# Patient Record
Sex: Female | Born: 1960 | Hispanic: No | Marital: Married | Smoking: Never smoker
Health system: Southern US, Community
[De-identification: ages and names within clinical notes are randomized; demographics above are authoritative.]

## PROBLEM LIST (undated history)

## (undated) DIAGNOSIS — G4730 Sleep apnea, unspecified: Secondary | ICD-10-CM

## (undated) DIAGNOSIS — G43009 Migraine without aura, not intractable, without status migrainosus: Secondary | ICD-10-CM

## (undated) DIAGNOSIS — E781 Pure hyperglyceridemia: Secondary | ICD-10-CM

## (undated) DIAGNOSIS — E669 Obesity, unspecified: Secondary | ICD-10-CM

## (undated) DIAGNOSIS — G4733 Obstructive sleep apnea (adult) (pediatric): Secondary | ICD-10-CM

## (undated) HISTORY — DX: Sleep apnea, unspecified: G47.30

## (undated) HISTORY — DX: Pure hyperglyceridemia: E78.1

## (undated) HISTORY — DX: Migraine without aura, not intractable, without status migrainosus: G43.009

## (undated) HISTORY — PX: COLON SURGERY: SHX602

## (undated) HISTORY — DX: Obesity, unspecified: E66.9

## (undated) HISTORY — PX: CHOLECYSTECTOMY: SHX55

## (undated) HISTORY — DX: Obstructive sleep apnea (adult) (pediatric): G47.33

---

## 1998-11-25 ENCOUNTER — Other Ambulatory Visit: Admission: RE | Admit: 1998-11-25 | Discharge: 1998-11-25 | Payer: Self-pay | Admitting: Obstetrics and Gynecology

## 1999-11-04 ENCOUNTER — Other Ambulatory Visit: Admission: RE | Admit: 1999-11-04 | Discharge: 1999-11-04 | Payer: Self-pay | Admitting: Obstetrics and Gynecology

## 2000-11-07 ENCOUNTER — Other Ambulatory Visit: Admission: RE | Admit: 2000-11-07 | Discharge: 2000-11-07 | Payer: Self-pay | Admitting: Obstetrics and Gynecology

## 2001-11-14 ENCOUNTER — Other Ambulatory Visit: Admission: RE | Admit: 2001-11-14 | Discharge: 2001-11-14 | Payer: Self-pay | Admitting: Obstetrics and Gynecology

## 2002-11-19 ENCOUNTER — Other Ambulatory Visit: Admission: RE | Admit: 2002-11-19 | Discharge: 2002-11-19 | Payer: Self-pay | Admitting: Obstetrics and Gynecology

## 2003-11-24 ENCOUNTER — Other Ambulatory Visit: Admission: RE | Admit: 2003-11-24 | Discharge: 2003-11-24 | Payer: Self-pay | Admitting: Obstetrics and Gynecology

## 2004-12-08 ENCOUNTER — Other Ambulatory Visit: Admission: RE | Admit: 2004-12-08 | Discharge: 2004-12-08 | Payer: Self-pay | Admitting: Obstetrics and Gynecology

## 2005-02-14 HISTORY — PX: ENDOMETRIAL ABLATION: SHX621

## 2005-12-28 ENCOUNTER — Other Ambulatory Visit: Admission: RE | Admit: 2005-12-28 | Discharge: 2005-12-28 | Payer: Self-pay | Admitting: Obstetrics and Gynecology

## 2006-03-21 ENCOUNTER — Ambulatory Visit (HOSPITAL_BASED_OUTPATIENT_CLINIC_OR_DEPARTMENT_OTHER): Admission: RE | Admit: 2006-03-21 | Discharge: 2006-03-21 | Payer: Self-pay | Admitting: Obstetrics and Gynecology

## 2006-03-21 ENCOUNTER — Encounter (INDEPENDENT_AMBULATORY_CARE_PROVIDER_SITE_OTHER): Payer: Self-pay | Admitting: *Deleted

## 2007-02-19 ENCOUNTER — Encounter: Admission: RE | Admit: 2007-02-19 | Discharge: 2007-02-19 | Payer: Self-pay | Admitting: Family Medicine

## 2007-05-07 ENCOUNTER — Other Ambulatory Visit: Admission: RE | Admit: 2007-05-07 | Discharge: 2007-05-07 | Payer: Self-pay | Admitting: Obstetrics and Gynecology

## 2007-05-27 DIAGNOSIS — G4733 Obstructive sleep apnea (adult) (pediatric): Secondary | ICD-10-CM

## 2007-05-27 HISTORY — DX: Obstructive sleep apnea (adult) (pediatric): G47.33

## 2008-01-31 IMAGING — US US SOFT TISSUE HEAD/NECK
1 series · 14 of 15 positions shown · non-contrast
Comparison: Ultrasound of the thyroid from [HOSPITAL]
dated 09/18/2006

CLINICAL DATA: Enlarged thyroid, follow-up

THYROID ULTRASOUND
TECHNIQUE: Ultrasound examination of the thyroid gland and
adjacent soft tissues was performed.

[Series 1: (person_name) · 0.05mm/px · 14 of 15 slices shown]
[im 1/15]
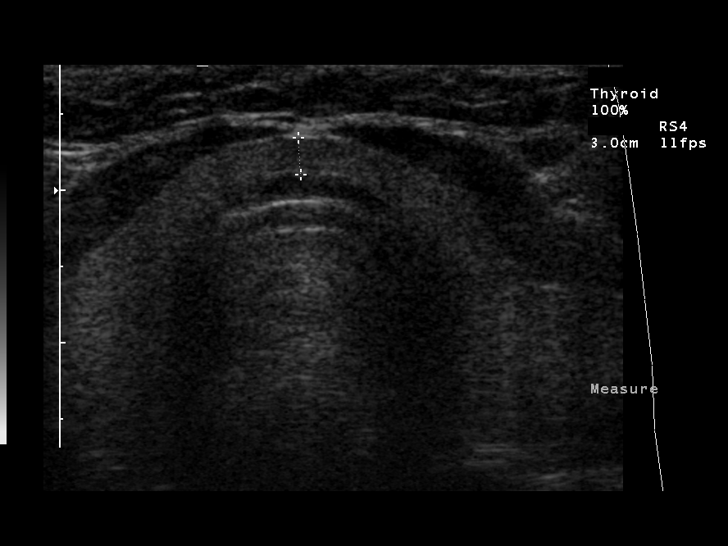
[im 2/15]
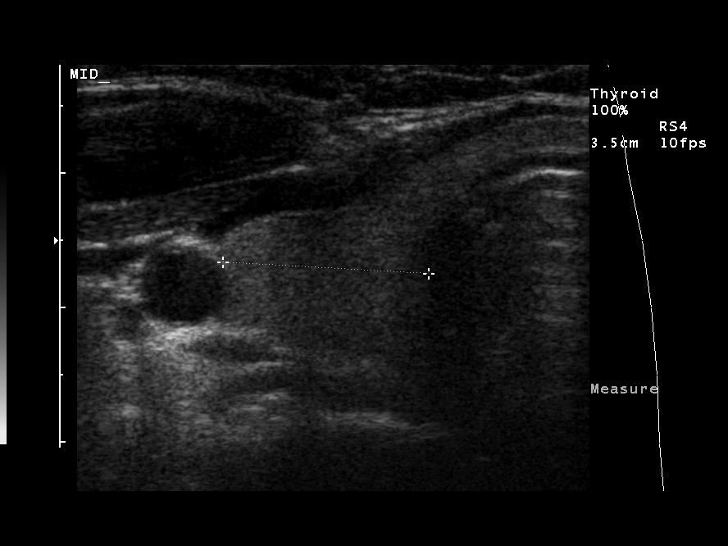
[im 3/15]
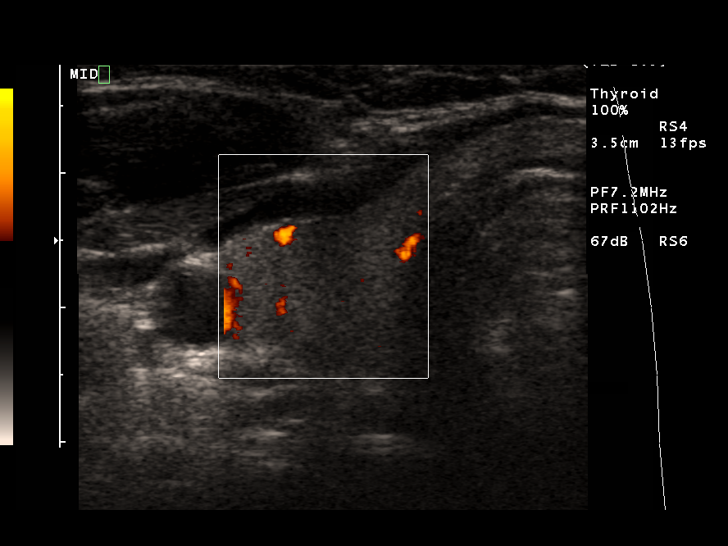
[im 4/15]
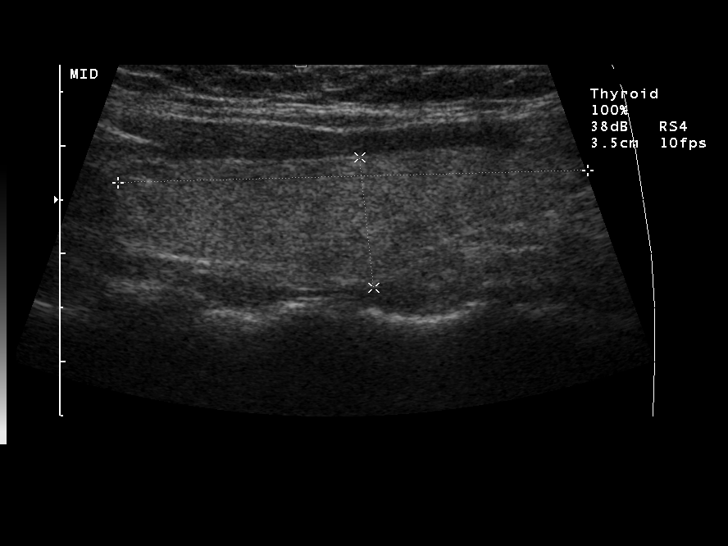
[im 5/15]
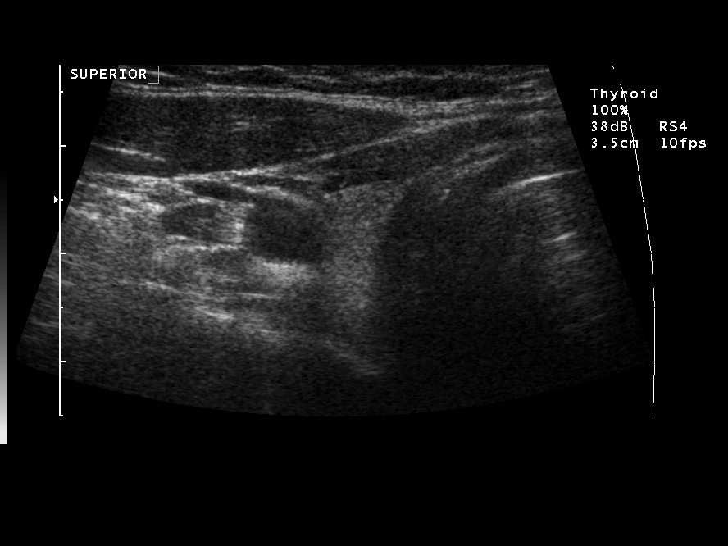
[im 6/15]
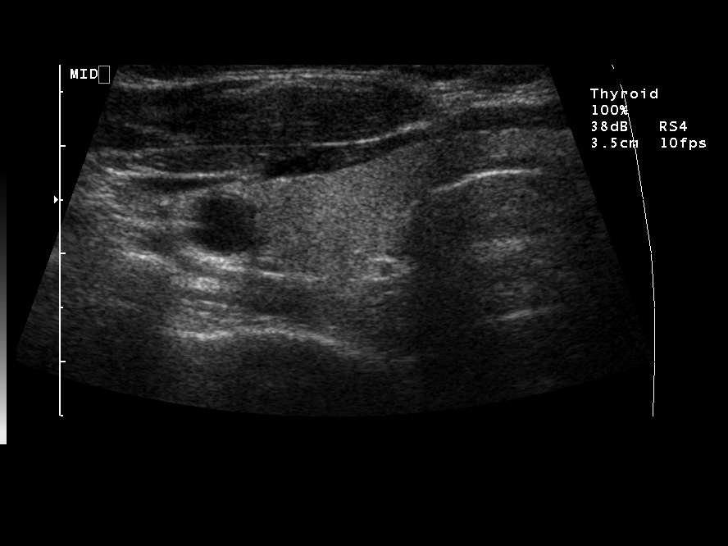
[im 7/15]
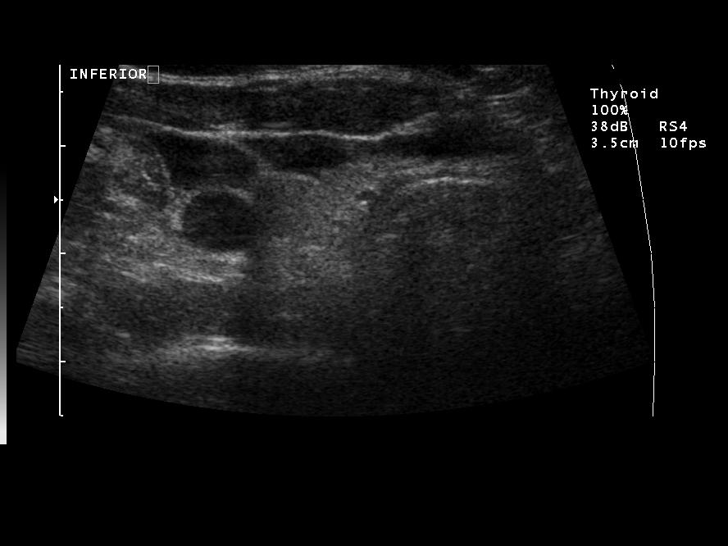
[im 9/15]
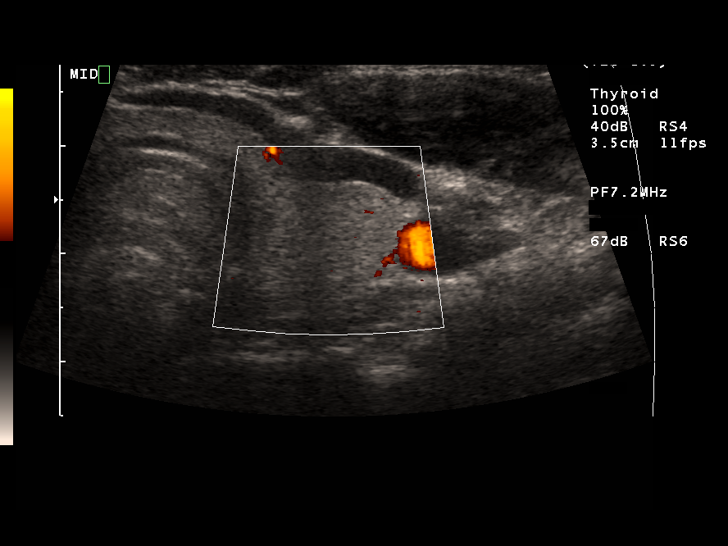
[im 10/15]
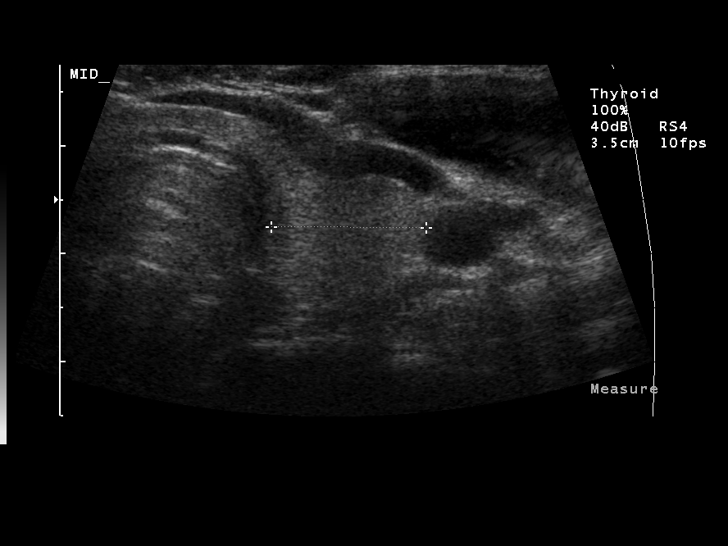
[im 11/15]
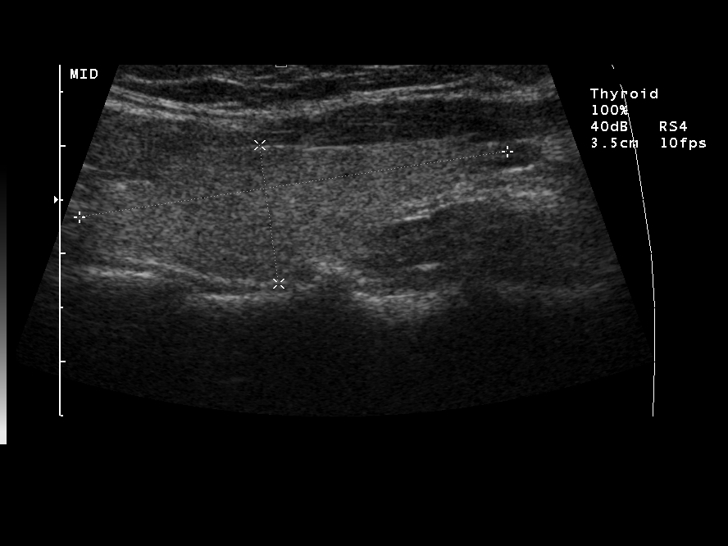
[im 12/15]
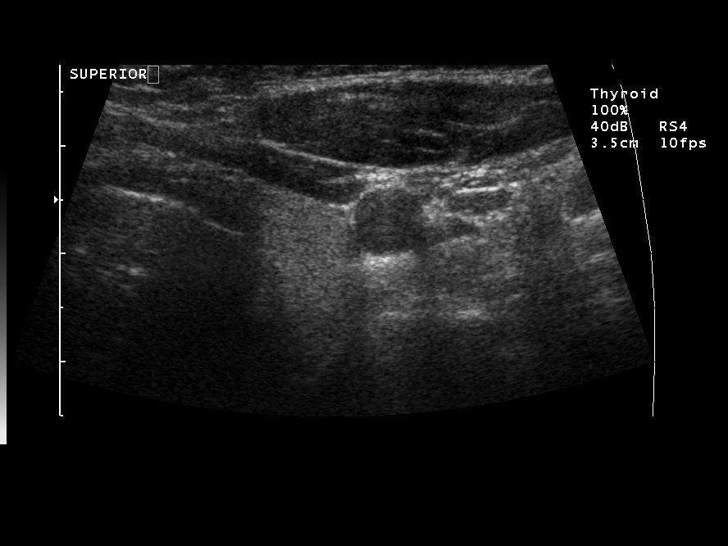
[im 13/15]
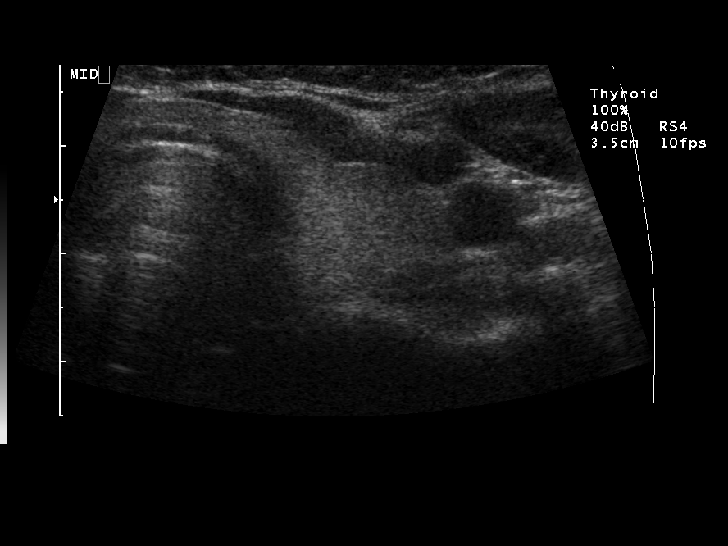
[im 14/15]
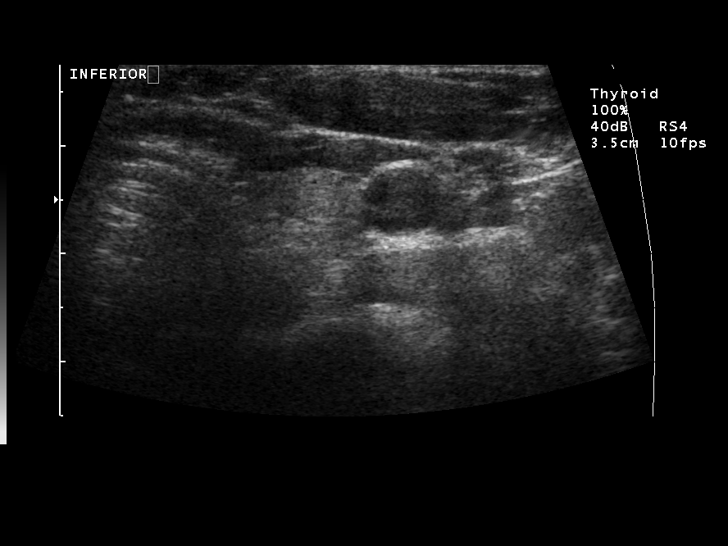
[im 15/15]
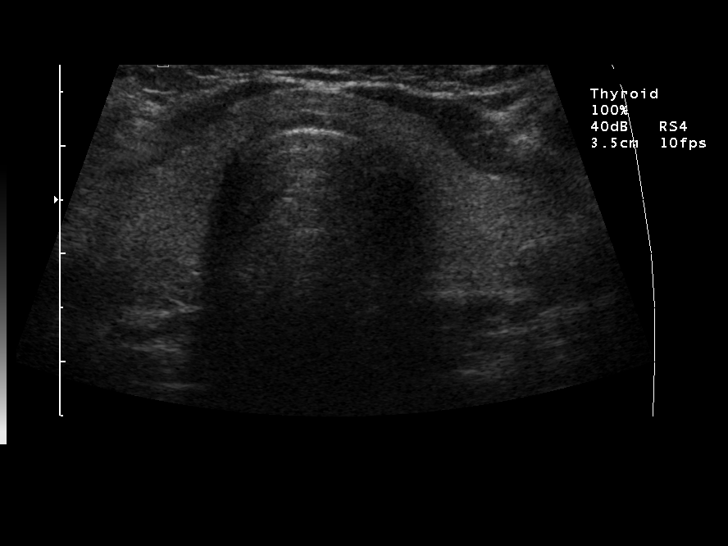

[14 of 15 positions shown; findings below may reference images not displayed]

FINDINGS: The thyroid gland is stable in size.  The right lobe
measures 4.9 cm sagittally, with a depth of 1.6 cm and width of
cm.  (Prior measurements were 4.4 x 1.2 x 1.5 cm).  The left lobe
currently measures 4.3 x 1.5 x 1.6 cm, with the isthmus measuring 3
mm.  (Prior measurements were 4.0 x 1.3 x 1.4 cm with the isthmus
measuring 2.4 mm).  No solid or cystic nodule is seen.
IMPRESSION: The thyroid gland is normal in size with no solid or cystic nodule
noted.

## 2008-05-07 ENCOUNTER — Other Ambulatory Visit: Admission: RE | Admit: 2008-05-07 | Discharge: 2008-05-07 | Payer: Self-pay | Admitting: Obstetrics and Gynecology

## 2008-07-03 IMAGING — US US ABDOMEN COMPLETE
1 series · 14 of 25 positions shown · non-contrast
Comparison: None.

CLINICAL DATA: Palpable mass right flank. 
 COMPLETE ABDOMINAL ULTRASOUND:
TECHNIQUE: Complete abdominal ultrasound examination was performed including evaluation of the liver, gallbladder, bile ducts, pancreas, kidneys, spleen, IVC, and abdominal aorta.

[Series 1: us abdomen complete · 0.24mm/px · 14 of 70 slices shown]
[im 1/70]
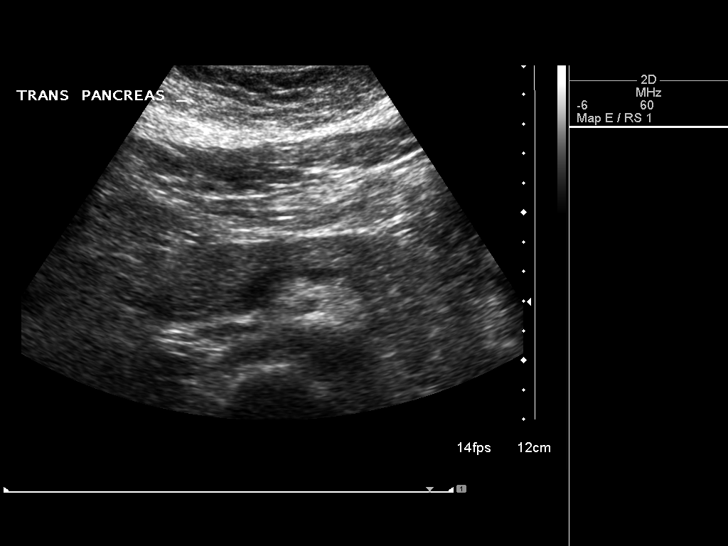
[im 6/70]
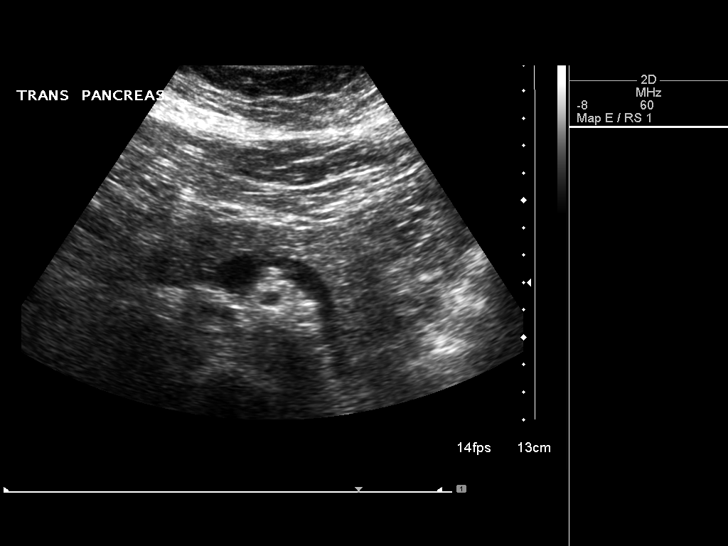
[im 12/70]
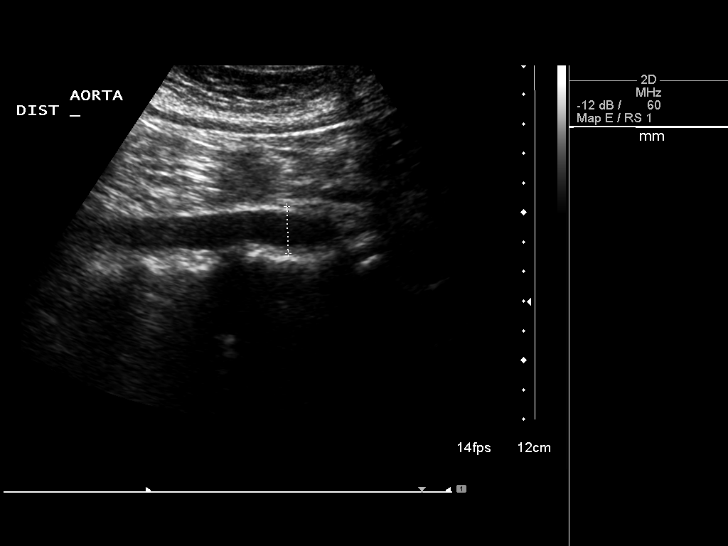
[im 18/70]
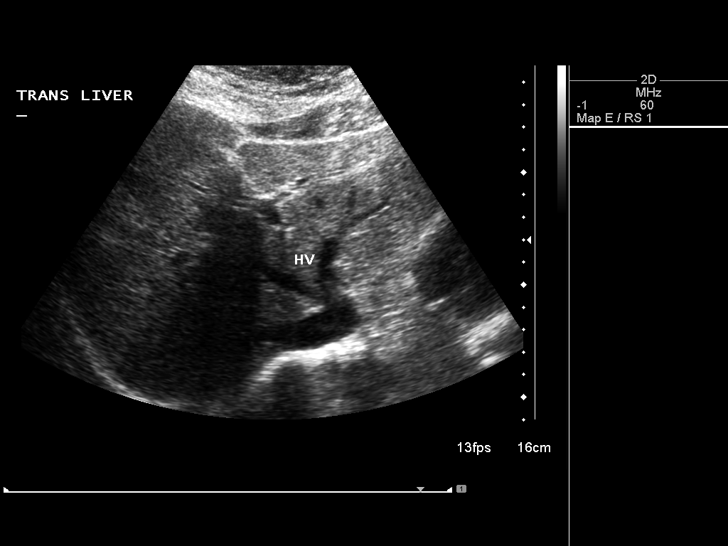
[im 24/70]
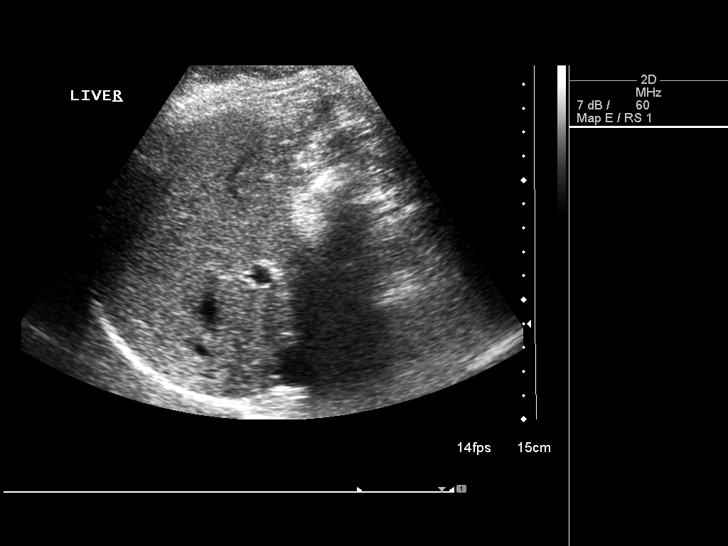
[im 26/70]
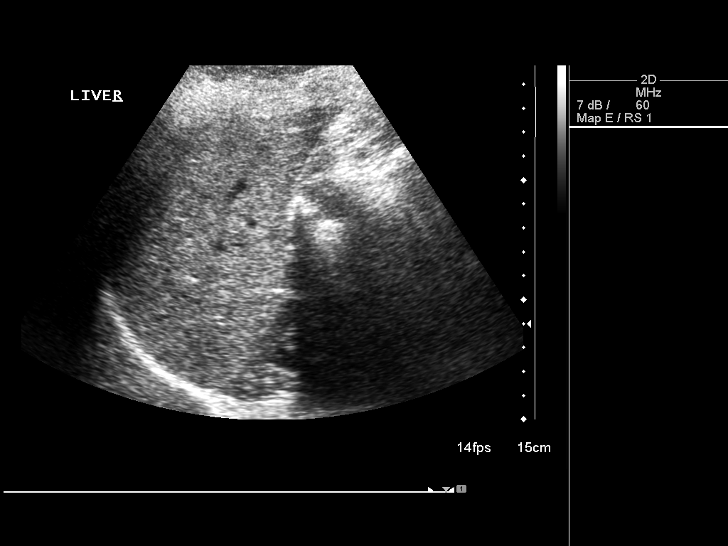
[im 32/70]
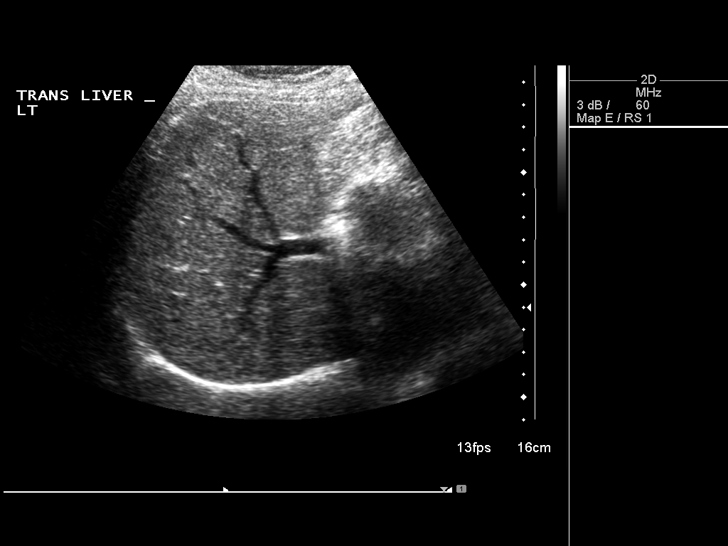
[im 38/70]
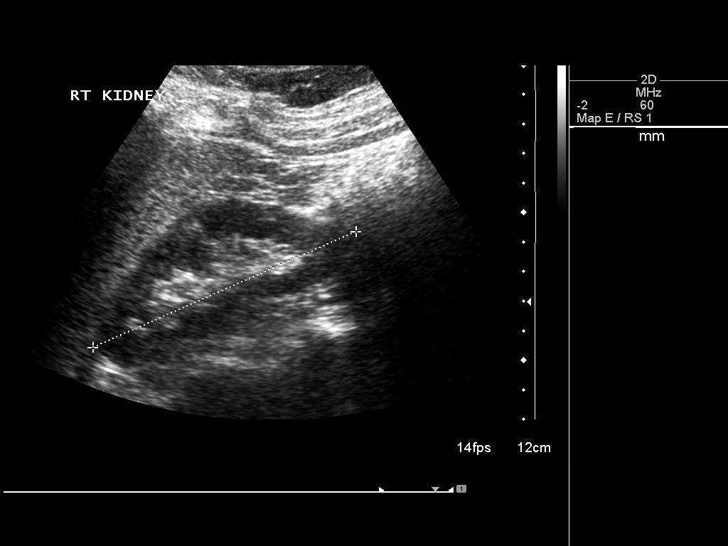
[im 44/70]
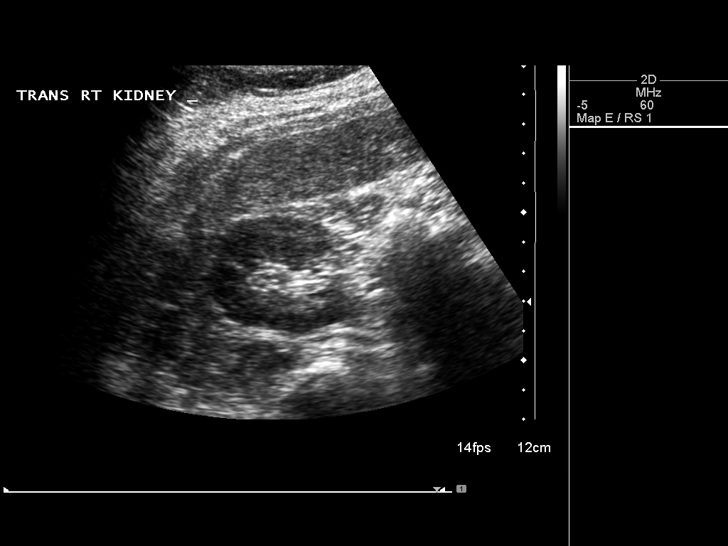
[im 47/70]
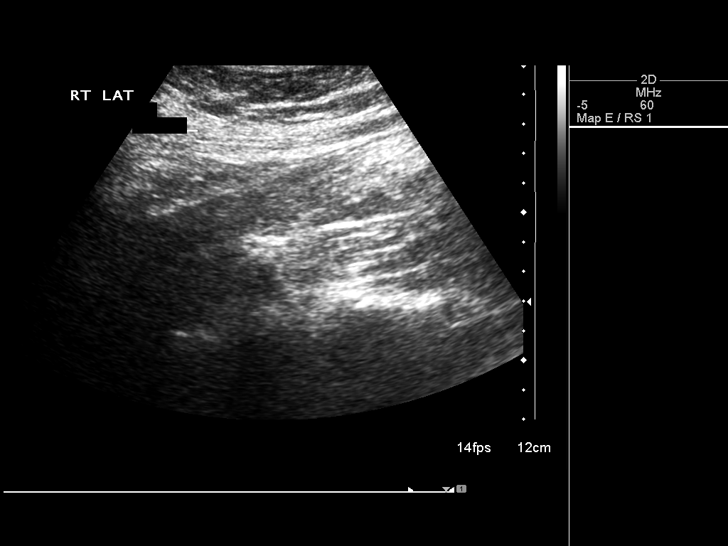
[im 52/70]
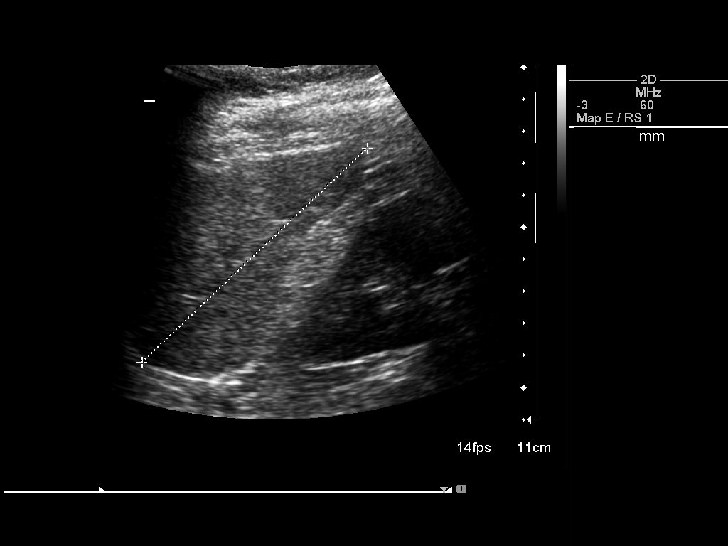
[im 58/70]
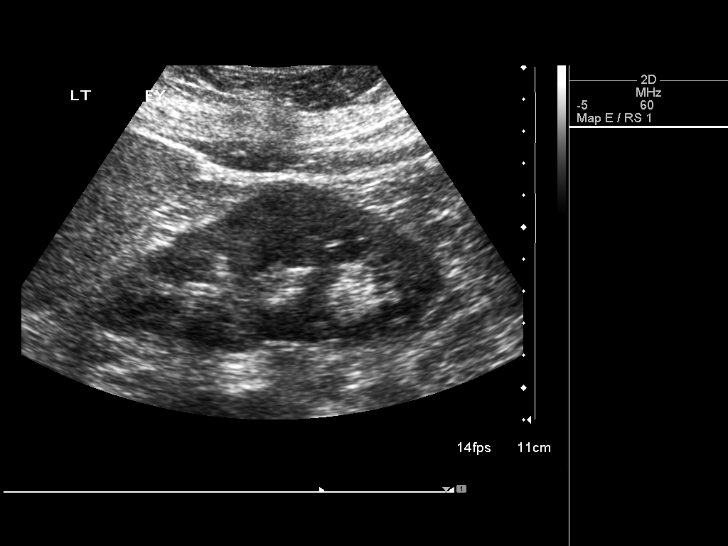
[im 64/70]
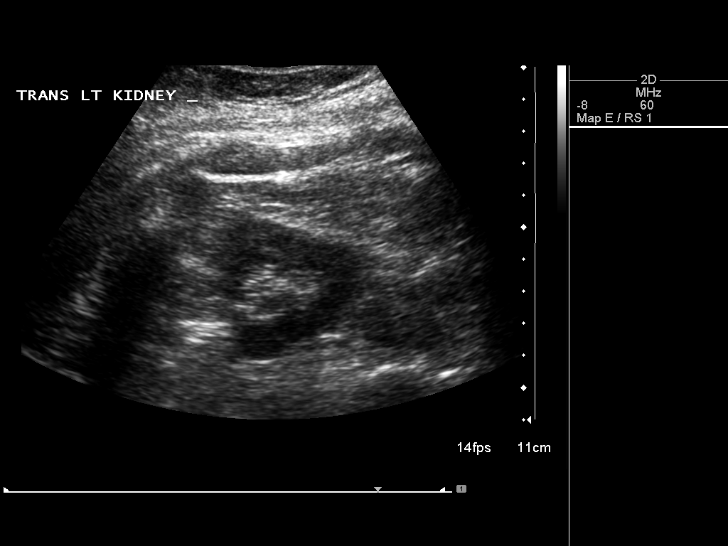
[im 70/70]
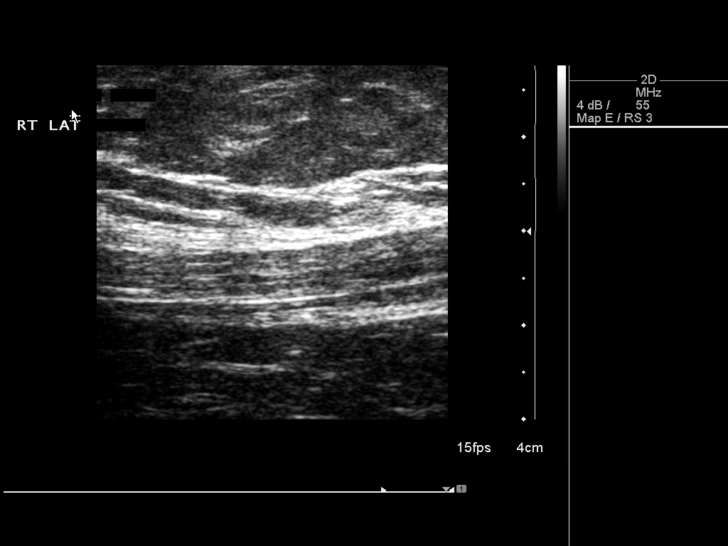

[14 of 25 positions shown; findings below may reference images not displayed]

FINDINGS: Liver is unremarkable.  Gallbladder is absent.  Extrahepatic bile duct, IVC, pancreas, spleen, kidneys, and aorta unremarkable.  No solid mass is seen at the site of palpable abnormality in the right lower quadrant.
IMPRESSION: No acute findings.  No mass.

## 2009-05-25 ENCOUNTER — Encounter: Admission: RE | Admit: 2009-05-25 | Discharge: 2009-05-25 | Payer: Self-pay | Admitting: Obstetrics and Gynecology

## 2010-07-02 NOTE — Op Note (Signed)
[name], Suzanne Schmidt                ACCOUNT NO.:  [id number]   MEDICAL RECORD NO.:  [id number]          PATIENT TYPE:  AMB   LOCATION:  [facility]                         FACILITY:  [facility]   PHYSICIAN:  Cynthia P. Romine, M.D.DATE OF BIRTH:  [birth year]   DATE OF PROCEDURE:  03/21/2006  DATE OF DISCHARGE:                               OPERATIVE REPORT   PREOPERATIVE DIAGNOSIS:  Menorrhagia with known submucous fibroids.   POSTOPERATIVE DIAGNOSIS:  Menorrhagia with known submucous fibroids,  path pending.   PROCEDURE:  Diagnostic laparoscopy, endometrial ablation using  Hydrotherm ablation and hysteroscopic resection of submucous fibroid.   SURGEON:  Dr. Arline Asp Romine.   ANESTHESIA:  General by LMA.   ESTIMATED BLOOD LOSS:  25 mL.   SORBITOL DEFICIT:  200 mL.   COMPLICATIONS:  None.   PROCEDURE:  The patient was taken to the operating room and after  induction of adequate general anesthesia was placed in dorsal lithotomy  position and prepped and draped in usual fashion.  A posterior weighted  and anterior Sims retractor placed.  The cervix was grasped on its  anterior lip with a single-tooth tenaculum.  The cervix was dilated to  #23 Shawnie Pons.  The HTA hysteroscope was introduced.  The three fibroids in  the endometrial cavity were seen as well as the tubal ostia.  Photographic documentation was taken.  As the procedure was begun and  the fluid was warming, there was noted to be a 10 mL loss at 6 mL per  minute.  It was felt that this was because the way to the hysteroscope  was then set up, the operator was unable to use the tenaculum stabilizer  on the hysteroscope.  Therefore the fluid was cooled, the sheath was  then removed from the uterus.  The hysteroscope was arranged so that the  tenaculum stabilizer could be used.  The hysteroscope was then  reinserted.  The procedure was started all over again.  The scope was  withdrawn to just inside the internal os.  A tenaculum stabilizer  was  used and the procedure was restarted there was no loss of fluid at all  and the HTA proceeded without incident throughout the 10 minute ablation  time.  Following the completion of the HTA, photographic documentation  was again taken.  After complete cooling of fluid, the scope was  withdrawn.  The cervix was dilated to #31 Shawnie Pons and the operative  hysteroscope was introduced.  Sorbitol was used as a distension medium  at 70 mmHg.  The noted fibroids were resected with a single loop.  They  were quite large and were taken out in multiple passes. The  hysteroscopic resection was very successful, the cavity was cleared  fibroids following completion of resection.  Photographic  documentation was taken.  The hysteroscope was removed from the uterus.  The instruments removed from the vagina and the procedure was  terminated.  The patient was taken in satisfactory condition to post  anesthesia recovery.      Cynthia P. Romine, M.D.  Electronically Signed     CPR/MEDQ  D:  03/21/2006  T:  03/21/2006  Job:  [id number]

## 2012-07-11 ENCOUNTER — Encounter: Payer: Self-pay | Admitting: *Deleted

## 2012-07-13 ENCOUNTER — Ambulatory Visit (INDEPENDENT_AMBULATORY_CARE_PROVIDER_SITE_OTHER): Payer: BC Managed Care – PPO | Admitting: Obstetrics and Gynecology

## 2012-07-13 ENCOUNTER — Encounter: Payer: Self-pay | Admitting: Obstetrics and Gynecology

## 2012-07-13 VITALS — BP 110/70 | HR 72 | Resp 16 | Ht 59.0 in | Wt 140.2 lb

## 2012-07-13 DIAGNOSIS — Z0000 Encounter for general adult medical examination without abnormal findings: Secondary | ICD-10-CM

## 2012-07-13 DIAGNOSIS — Z01419 Encounter for gynecological examination (general) (routine) without abnormal findings: Secondary | ICD-10-CM

## 2012-07-13 LAB — POCT URINALYSIS DIPSTICK
Glucose, UA: NEGATIVE
Leukocytes, UA: NEGATIVE
Nitrite, UA: NEGATIVE
pH, UA: 5

## 2012-07-13 MED ORDER — NORETHINDRONE 0.35 MG PO TABS: 1.0000 | ORAL_TABLET | Freq: Every day | ORAL | Status: DC

## 2012-07-13 NOTE — Patient Instructions (Addendum)

## 2012-07-13 NOTE — Progress Notes (Signed)
52 y.o.   Married    Caucasian   female   G2P2   here for annual exam.  Amenorrheic on [place]; s/p HTA in 2007 as well.  No v-m sx.  Steroid injection into right shoulder for rotator cuff irritation;  Now having same problem with left shoulder. Wears CPAP for sleep apnea.   No LMP recorded. Patient is postmenopausal.          Sexually active: yes  The current method of family planning is OCP (estrogen/progesterone).    Exercising: Not doing any right now intends to start after seeing health coach Last mammogram:  06/27/11 scheduled for next week Last pap smear:05/13/09 History of abnormal pap:no  Smoking:no Alcohol:no Last colonoscopy:6/11 Last Bone Density:  03 oseopenia Last tetanus shot:3/09 Last cholesterol check: 4/14 [facility]  Hgb:                Urine:normal   Family History  Problem Relation Age of Onset  . Osteoporosis Mother   . Hypertension Father   . Macular degeneration Father     There are no active problems to display for this patient.   Past Medical History  Diagnosis Date  . Sleep apnea   . Migraine without aura     Past Surgical History  Procedure Laterality Date  . Cholecystectomy    . Cesarean section      x2  . Endometrial ablation    . Colon surgery      Allergies: Review of patient's allergies indicates no known allergies.  Current Outpatient Prescriptions  Medication Sig Dispense Refill  . Aspirin-Acetaminophen-Caffeine (MIGRAINE FORMULA PO) Take by mouth.      Marland Kitchen atenolol (TENORMIN) 100 MG tablet Take 100 mg by mouth daily.      . Calcium Carbonate (CALCIUM 600 PO) Take by mouth.      . fish oil-omega-3 fatty acids 1000 MG capsule Take 2 g by mouth daily.      . Multiple Vitamin (MULTIVITAMIN) capsule Take 1 capsule by mouth daily.      . norethindrone (MICRONOR,CAMILA,ERRIN) 0.35 MG tablet Take 1 tablet by mouth daily.      . rizatriptan (MAXALT) 10 MG tablet Take 10 mg by mouth as needed for migraine. May repeat in 2  hours if needed      . Vitamin D, Ergocalciferol, (DRISDOL) 50000 UNITS CAPS Take 50,000 Units by mouth.       No current facility-administered medications for this visit.    ROS: Pertinent items are noted in HPI.  Social Hx:  Married, daughter Sharyl Nimrod, married with a 9 yo son Heloise Purpura and son Nicola Girt, a rising Sr. In history at [employer]. Pt works as Psychologist, counselling  Exam:    BP 110/70  Pulse 72  Resp 16  Ht 4\' 11"  (1.499 m)  Wt 140 lb 3.2 oz (63.594 kg)  BMI 28.3 kg/m2  Ht stable, wt up 4 pounds since last  year Wt Readings from Last 3 Encounters:  07/13/12 140 lb 3.2 oz (63.594 kg)     Ht Readings from Last 3 Encounters:  07/13/12 4\' 11"  (1.499 m)    General appearance: alert, cooperative and appears stated age Head: Normocephalic, without obvious abnormality, atraumatic Neck: no adenopathy, supple, symmetrical, trachea midline and thyroid not enlarged, symmetric, no tenderness/mass/nodules Lungs: clear to auscultation bilaterally Breasts: Inspection negative, No nipple retraction or dimpling, No nipple discharge or bleeding, No axillary or supraclavicular adenopathy, Normal to palpation without dominant masses Heart: regular rate  and rhythm Abdomen: soft, non-tender; bowel sounds normal; no masses,  no organomegaly Extremities: extremities normal, atraumatic, no cyanosis or edema Skin: Skin color, texture, turgor normal. No rashes or lesions Lymph nodes: Cervical, supraclavicular, and axillary nodes normal. No abnormal inguinal nodes palpated Neurologic: Grossly normal   Pelvic: External genitalia:  no lesions              Urethra:  normal appearing urethra with no masses, tenderness or lesions              Bartholins and Skenes: normal                 Vagina: normal appearing vagina with normal color and discharge, no lesions              Cervix: normal appearance              Pap taken: yes        Bimanual Exam:  Uterus:  uterus is normal size, shape, consistency  and non tender, mid, mobile                                      Adnexa: normal adnexa in size, nontender and no masses                                      Rectovaginal: Confirms                                      Anus:  normal sphincter tone, no lesions  A: normal peri-menopausal exam     S/p HTA 2007     Osteoporosis risk with mother with severe osteoporosis     Sleep apnea, uses CPAP     Migraine w/o aura     P: mammogram pap smear counseled on breast self exam, mammography screening, adequate intake of calcium and vitamin D, diet and exercise return annually or prn   Continue camila for one more year, and then consider d/c and possibly HRT if desired.   Change to OTC Vit D 1000 U per day.   An After Visit Summary was printed and given to the patient.

## 2012-07-16 LAB — HEMOGLOBIN, FINGERSTICK: Hemoglobin, fingerstick: 15.4 g/dL (ref 12.0–16.0)

## 2012-07-18 LAB — IPS PAP TEST WITH HPV

## 2012-07-25 ENCOUNTER — Telehealth: Payer: Self-pay | Admitting: *Deleted

## 2012-07-25 NOTE — Telephone Encounter (Signed)
Form for [facility] in your office to sign/ Dr. Tresa Res patient.

## 2012-07-27 ENCOUNTER — Telehealth: Payer: Self-pay | Admitting: *Deleted

## 2012-07-27 NOTE — Telephone Encounter (Signed)
[facility] mammogram on hold per Dr. Jonathon Bellows. Romine/sue

## 2012-07-29 ENCOUNTER — Other Ambulatory Visit: Payer: Self-pay | Admitting: Obstetrics and Gynecology

## 2012-07-30 NOTE — Telephone Encounter (Signed)
RX filled on 07/13/12.  RX denied.

## 2012-08-02 ENCOUNTER — Other Ambulatory Visit: Payer: Self-pay | Admitting: Obstetrics and Gynecology

## 2012-08-02 NOTE — Telephone Encounter (Signed)
Patient was seen 07/13/12 no lab done for Vitamin D please advise on refill.

## 2012-08-02 NOTE — Telephone Encounter (Signed)
Need chart please

## 2012-08-03 NOTE — Telephone Encounter (Signed)
Chart on your desk.

## 2012-08-03 NOTE — Telephone Encounter (Signed)
Please let her know we should repeat Vit D.  Last one was 2011.  Please make sure front knows no copay charge for lab visit.  I will put in order if you tell me when she is coming.

## 2012-08-03 NOTE — Telephone Encounter (Signed)
Left message for call back.

## 2012-08-08 NOTE — Telephone Encounter (Signed)
I have attempted to contact this patient by phone with the following results: left message to return my call on answering machine (mobile).  

## 2012-08-09 NOTE — Telephone Encounter (Signed)
I have attempted to contact this patient by phone with the following results: left message to return my call on answering machine (home).  

## 2012-08-10 NOTE — Telephone Encounter (Signed)
Encounter closed

## 2012-08-10 NOTE — Telephone Encounter (Signed)
Stop calling.  Close encounter.

## 2012-12-20 ENCOUNTER — Other Ambulatory Visit: Payer: Self-pay

## 2013-04-10 ENCOUNTER — Encounter: Payer: Self-pay | Admitting: Obstetrics & Gynecology

## 2013-04-25 ENCOUNTER — Encounter: Payer: Self-pay | Admitting: General Surgery

## 2013-04-25 DIAGNOSIS — G4733 Obstructive sleep apnea (adult) (pediatric): Secondary | ICD-10-CM | POA: Insufficient documentation

## 2013-04-25 DIAGNOSIS — E669 Obesity, unspecified: Secondary | ICD-10-CM | POA: Insufficient documentation

## 2013-04-30 ENCOUNTER — Encounter: Payer: Self-pay | Admitting: Cardiology

## 2013-04-30 ENCOUNTER — Ambulatory Visit (INDEPENDENT_AMBULATORY_CARE_PROVIDER_SITE_OTHER): Payer: BC Managed Care – PPO | Admitting: Cardiology

## 2013-04-30 VITALS — BP 135/75 | HR 84 | Ht 59.0 in | Wt 143.0 lb

## 2013-04-30 DIAGNOSIS — E669 Obesity, unspecified: Secondary | ICD-10-CM

## 2013-04-30 DIAGNOSIS — G4733 Obstructive sleep apnea (adult) (pediatric): Secondary | ICD-10-CM

## 2013-04-30 NOTE — Progress Notes (Signed)
[address] [place], [place]  [address] Phone: [phone] Fax:  [phone]  Date:  04/30/2013   ID:  Suzanne Breachaula G Lesure, DOB 01/22/1961, MRN [id number]  PCP:  Gweneth DimitriMCNEILL,WENDY, MD  Sleep Medicine:  Armanda Magictraci Abrham Maslowski, MD   History of Present Illness: Suzanne Schmidt is a 53 y.o. female with a history of OSA and obesity who presents today for followup.  She is doing well.  She tolerates her CPAP device without any problems.  She tolerates the nasal pillow mask and feels the pressure is adequate.  She does not snore and does not thinks she mouth breathes any.  She feels rested in the am and has no daytime sleepiness.   Wt Readings from Last 3 Encounters:  04/30/13 143 lb (64.864 kg)  07/13/12 140 lb 3.2 oz (63.594 kg)     Past Medical History  Diagnosis Date  . Sleep apnea   . Migraine without aura   . OSA (obstructive sleep apnea) 05/27/2007    PSG w RDI 16.3 and AHI 4.52   . Hypertriglyceridemia   . Obesity     Current Outpatient Prescriptions  Medication Sig Dispense Refill  . Aspirin-Acetaminophen-Caffeine (MIGRAINE FORMULA PO) Take by mouth.      Marland Kitchen. atenolol (TENORMIN) 100 MG tablet Take 100 mg by mouth daily.      . Calcium Carbonate (CALCIUM 600 PO) Take by mouth.      . fish oil-omega-3 fatty acids 1000 MG capsule Take 2 g by mouth daily.      . Multiple Vitamin (MULTIVITAMIN) capsule Take 1 capsule by mouth daily.      . norethindrone (MICRONOR,CAMILA,ERRIN) 0.35 MG tablet Take 1 tablet (0.35 mg total) by mouth daily.  3 Package  3  . rizatriptan (MAXALT) 10 MG tablet Take 10 mg by mouth as needed for migraine. May repeat in 2 hours if needed      . UNABLE TO FIND CPAP      . Vitamin D, Ergocalciferol, (DRISDOL) 50000 UNITS CAPS Take 50,000 Units by mouth.       No current facility-administered medications for this visit.    Allergies:   No Known Allergies  Social History:  The patient  reports that she has never smoked. She has never used smokeless tobacco. She  reports that she does not drink alcohol or use illicit drugs.   Family History:  The patient's family history includes Hypertension in her brother and father; Macular degeneration in her father; Osteoporosis in her mother; Prostate cancer in her father.   ROS:  Please see the history of present illness.      All other systems reviewed and negative.   PHYSICAL EXAM: VS:  BP 135/75  Pulse 84  Ht 4\' 11"  (1.499 m)  Wt 143 lb (64.864 kg)  BMI 28.87 kg/m2 Well nourished, well developed, in no acute distress HEENT: normal Neck: no JVD Cardiac:  normal S1, S2; RRR; no murmur Lungs:  clear to auscultation bilaterally, no wheezing, rhonchi or rales Abd: soft, nontender, no hepatomegaly Ext: no edema Skin: warm and dry Neuro:  CNs 2-12 intact, no focal abnormalities noted       ASSESSMENT AND PLAN:  1. OSA on CPAP therapy and tolerating well.  His download showed an AHI of 3.1/hr on 12cm H2O and 97% compliance in using more than 4 hours nightly.  She will continue on current settings. 2. Obesity - I have encouraged her to increase her exercise to try to lose  some weight  Followup with me in 6 months  Signed, Armanda Magic, MD 04/30/2013 1:57 PM

## 2013-04-30 NOTE — Patient Instructions (Signed)
Your physician recommends that you continue on your current medications as directed. Please refer to the Current Medication list given to you today.  Your physician wants you to follow-up in: 6 months with Dr Turner You will receive a reminder letter in the mail two months in advance. If you don't receive a letter, please call our office to schedule the follow-up appointment.  

## 2013-06-24 ENCOUNTER — Telehealth: Payer: Self-pay | Admitting: Gynecology

## 2013-06-24 MED ORDER — NORETHINDRONE 0.35 MG PO TABS: 1.0000 | ORAL_TABLET | Freq: Every day | ORAL | Status: DC

## 2013-06-24 NOTE — Telephone Encounter (Signed)
Last AEX and refill 07/13/12 #3/3 refills  Rx sent for 1 month until appt. - Patient notified.

## 2013-06-24 NOTE — Telephone Encounter (Signed)
Patient needs refill for her bc to get her until her appt 07/19/13  [facility] [address] [place]

## 2013-07-19 ENCOUNTER — Encounter: Payer: Self-pay | Admitting: Gynecology

## 2013-07-19 ENCOUNTER — Ambulatory Visit: Payer: BC Managed Care – PPO | Admitting: Obstetrics and Gynecology

## 2013-07-19 ENCOUNTER — Ambulatory Visit (INDEPENDENT_AMBULATORY_CARE_PROVIDER_SITE_OTHER): Payer: BC Managed Care – PPO | Admitting: Gynecology

## 2013-07-19 VITALS — BP 106/70 | HR 66 | Resp 18 | Ht 59.25 in | Wt 144.0 lb

## 2013-07-19 DIAGNOSIS — Z793 Long term (current) use of hormonal contraceptives: Secondary | ICD-10-CM

## 2013-07-19 DIAGNOSIS — N912 Amenorrhea, unspecified: Secondary | ICD-10-CM

## 2013-07-19 DIAGNOSIS — Z0000 Encounter for general adult medical examination without abnormal findings: Secondary | ICD-10-CM

## 2013-07-19 DIAGNOSIS — Z01419 Encounter for gynecological examination (general) (routine) without abnormal findings: Secondary | ICD-10-CM

## 2013-07-19 DIAGNOSIS — Z23 Encounter for immunization: Secondary | ICD-10-CM

## 2013-07-19 LAB — POCT URINALYSIS DIPSTICK
Leukocytes, UA: NEGATIVE
RBC UA: 5
Urobilinogen, UA: NEGATIVE

## 2013-07-19 LAB — HEMOGLOBIN, FINGERSTICK: Hemoglobin, fingerstick: 13.7 g/dL (ref 12.0–16.0)

## 2013-07-19 NOTE — Patient Instructions (Signed)

## 2013-07-19 NOTE — Progress Notes (Signed)
53 y.o. Married Caucasian female   G2P2 here for annual exam. Pt reports menses are regular.  She does not report hot flashes, does not have night sweats, does have vaginal dryness. Pt is having 2-3 migraines/m, stable, pt is on tenormin.  Using camilla for contraception.  No menopausal symptoms.  Pt has new grandson and is interested in Tdap.  Mammogram due 7/15, +/- BSE.  No LMP recorded. Patient is postmenopausal.          Sexually active: yes  The current method of family planning is OCP (estrogen/progesterone).    Exercising: no  The patient does not participate in regular exercise at present. Last pap: 07/13/12 NEG HR HPV Abnormal PAP: No Mammogram: 02/14/13 f/u recommending in 6 months BSE: occ when she thinks about it  Colonoscopy: 3-4 years ago- Normal f/u in 10 years ago  DEXA: 15 years ago  Alcohol: no Tobacco: no  Hgb: 13.7   ; Urine: Negative  Health Maintenance  Topic Date Due  . Tetanus/tdap  01/24/1978  . Colonoscopy  01/24/2009  . Influenza Vaccine  09/14/2013  . Mammogram  03/01/2015  . Pap Smear  07/14/2015    Family History  Problem Relation Age of Onset  . Osteoporosis Mother   . Hypertension Father   . Macular degeneration Father   . Prostate cancer Father   . Hypertension Brother     Patient Active Problem List   Diagnosis Date Noted  . Obstructive sleep apnea (adult) (pediatric) 04/25/2013  . Obesity 04/25/2013    Past Medical History  Diagnosis Date  . Sleep apnea   . Migraine without aura   . OSA (obstructive sleep apnea) 05/27/2007    PSG w RDI 16.3 and AHI 4.52   . Hypertriglyceridemia   . Obesity     Past Surgical History  Procedure Laterality Date  . Cholecystectomy    . Cesarean section      x2  . Endometrial ablation    . Colon surgery      Allergies: Review of patient's allergies indicates no known allergies.  Current Outpatient Prescriptions  Medication Sig Dispense Refill  . Aspirin-Acetaminophen-Caffeine (MIGRAINE  FORMULA PO) Take by mouth.      Marland Kitchen atenolol (TENORMIN) 100 MG tablet Take 100 mg by mouth daily.      . Calcium Carbonate (CALCIUM 600 PO) Take by mouth.      . fish oil-omega-3 fatty acids 1000 MG capsule Take 2 g by mouth daily.      . Multiple Vitamin (MULTIVITAMIN) capsule Take 1 capsule by mouth daily.      . norethindrone (MICRONOR,CAMILA,ERRIN) 0.35 MG tablet Take 1 tablet (0.35 mg total) by mouth daily.  1 Package  0  . rizatriptan (MAXALT) 10 MG tablet Take 10 mg by mouth as needed for migraine. May repeat in 2 hours if needed      . UNABLE TO FIND CPAP      . Vitamin D, Ergocalciferol, (DRISDOL) 50000 UNITS CAPS Take 50,000 Units by mouth.       No current facility-administered medications for this visit.    ROS: Pertinent items are noted in HPI.  Exam:    There were no vitals taken for this visit. Weight change: @WEIGHTCHANGE @ Last 3 height recordings:  Ht Readings from Last 3 Encounters:  04/30/13 4\' 11"  (1.499 m)  07/13/12 4\' 11"  (1.499 m)   General appearance: alert, cooperative and appears stated age Head: Normocephalic, without obvious abnormality, atraumatic Neck: no adenopathy, no carotid  bruit, no JVD, supple, symmetrical, trachea midline and thyroid not enlarged, symmetric, no tenderness/mass/nodules Lungs: clear to auscultation bilaterally Breasts: normal appearance, no masses or tenderness Heart: regular rate and rhythm, S1, S2 normal, no murmur, click, rub or gallop Abdomen: soft, non-tender; bowel sounds normal; no masses,  no organomegaly Extremities: extremities normal, atraumatic, no cyanosis or edema Skin: Skin color, texture, turgor normal. No rashes or lesions Lymph nodes: Cervical, supraclavicular, and axillary nodes normal. no inguinal nodes palpated Neurologic: Grossly normal   Pelvic: External genitalia:  no lesions              Urethra: normal appearing urethra with no masses, tenderness or lesions              Bartholins and Skenes: normal                  Vagina: normal appearing vagina with normal color and discharge, no lesions              Cervix: normal appearance              Pap taken: no        Bimanual Exam:  Uterus:  uterus is normal size, shape, consistency and nontender                                      Adnexa:    no masses                                      Rectovaginal: Confirms                                      Anus:  normal sphincter tone, no lesions  A: well woman Contraceptive management     P: mammogram due 7/15 Pt is 53, avg age for menopause 989, can stop micronor and [facility], refill given for now, but pt will comply with blood work counseled on breast self exam, mammography screening, menopause, osteoporosis, adequate intake of calcium and vitamin D, diet and exercise return annually or prn Discussed PAP guideline changes, importance of weight bearing exercises, calcium, vit D and balanced diet.  An After Visit Summary was printed and given to the patient.

## 2013-07-19 NOTE — Addendum Note (Signed)
Addended by: Dion Body on: 07/19/2013 02:53 PM   Modules accepted: Orders

## 2013-07-22 ENCOUNTER — Telehealth: Payer: Self-pay | Admitting: Gynecology

## 2013-07-22 MED ORDER — NORETHINDRONE 0.35 MG PO TABS: 1.0000 | ORAL_TABLET | Freq: Every day | ORAL | Status: DC

## 2013-07-22 NOTE — Telephone Encounter (Signed)
Left message to call Kaitlyn at [phone].   Per OV notes with Dr.Lathrop from 6/5 patient was to have refill for micronor until she comes in for blood work and which time treatment will comply with blood work results. Rx for micronor 0.35mg  sent over to pharmacy of choice for one month with no RF. Advise patient of RF sent in for one month. Patient needs to make nurse visit to have [facility] drawn.

## 2013-07-22 NOTE — Telephone Encounter (Signed)
Patient says her birth control prescription was not sent to her pharmacy last week. Patient says you can leave details on her voicemail.

## 2013-07-23 MED ORDER — NORETHINDRONE 0.35 MG PO TABS: 1.0000 | ORAL_TABLET | Freq: Every day | ORAL | Status: DC

## 2013-07-23 NOTE — Telephone Encounter (Signed)
Left message to call Casandra Dallaire at [phone]. 

## 2013-07-23 NOTE — Telephone Encounter (Signed)
Dr. Farrel Gobble, can you please clarify. Patient feels that you discussed that she would stay on Micronor until the end of summer, then she would dc Micronor and then come in for [facility]. When do you want her to dc the Micronor and how long after dc do you want her to come in for [facility]. Patient will need refills to last until end of summer.

## 2013-07-23 NOTE — Telephone Encounter (Signed)
She could stop the micronor in September, but should use a condom and then we can check FSH 4w later

## 2013-07-23 NOTE — Telephone Encounter (Signed)
Left detailed message on voicemail at number provided [phone] per patient request and ROI. Advised patient of message from Dr.Lathrop as seen below. Advised to call back with any further questions.  Routing to provider for final review. Patient agreeable to disposition. Will close encounter

## 2013-07-29 ENCOUNTER — Telehealth: Payer: Self-pay | Admitting: Gynecology

## 2013-07-29 MED ORDER — NORETHINDRONE 0.35 MG PO TABS: 1.0000 | ORAL_TABLET | Freq: Every day | ORAL | Status: DC

## 2013-07-29 NOTE — Telephone Encounter (Signed)
Pt says she got the message to stay on birth control pill until September but only 1 month was called in.

## 2013-07-29 NOTE — Telephone Encounter (Signed)
[name] at [facility] to verify refills on OCP. No refills remaining on patient's OCP. Refills sent over electronically until September to pharmacy of choice.Spoke with patient. Patient states that she picked up OCP today and was given one pack. Advised patient that three more were sent over today to get patient to September. Patient agreeable and verbalizes understanding.  Routing to provider for final review. Patient agreeable to disposition. Will close encounter

## 2013-08-08 ENCOUNTER — Encounter: Payer: Self-pay | Admitting: Gynecology

## 2013-08-13 ENCOUNTER — Telehealth: Payer: Self-pay | Admitting: Obstetrics & Gynecology

## 2013-08-13 NOTE — Telephone Encounter (Signed)
Pt calling to have [facility] 6 [facility] mamm[facility]. Appointment is 08/30/2013.

## 2013-08-13 NOTE — Telephone Encounter (Signed)
[facility] with fax confirmation received. Left detailed message at number provided [phone]. Okay per ROI. Advised [facility]. Advised to call back with any further needs or questions.   Routing to provider for final review. Patient agreeable to disposition. Will close encounter

## 2013-08-13 NOTE — Telephone Encounter (Signed)
Dr.Lathrop, order for review and sign outside your door.

## 2013-09-14 DIAGNOSIS — G43009 Migraine without aura, not intractable, without status migrainosus: Secondary | ICD-10-CM | POA: Insufficient documentation

## 2013-09-14 DIAGNOSIS — E781 Pure hyperglyceridemia: Secondary | ICD-10-CM | POA: Insufficient documentation

## 2013-11-05 ENCOUNTER — Ambulatory Visit: Payer: BC Managed Care – PPO | Admitting: Cardiology

## 2013-11-19 ENCOUNTER — Other Ambulatory Visit (INDEPENDENT_AMBULATORY_CARE_PROVIDER_SITE_OTHER): Payer: BC Managed Care – PPO

## 2013-11-19 DIAGNOSIS — Z793 Long term (current) use of hormonal contraceptives: Principal | ICD-10-CM

## 2013-11-19 DIAGNOSIS — N912 Amenorrhea, unspecified: Secondary | ICD-10-CM

## 2013-11-20 LAB — FOLLICLE STIMULATING HORMONE: FSH: 59.3 m[IU]/mL

## 2013-11-25 ENCOUNTER — Telehealth: Payer: Self-pay | Admitting: Gynecology

## 2013-11-25 NOTE — Telephone Encounter (Signed)
Pt its calling about results from last weeks appt

## 2013-11-25 NOTE — Telephone Encounter (Signed)
FSH is 59, she should be ok without back up

## 2013-11-25 NOTE — Telephone Encounter (Signed)
Spoke with patient. Results given as seen below. Patient is agreeable and verbalizes understanding. Patient states that she has not had a cycle in years since her ablation. Patient would like me to check with Dr.Lathrop about chances of getting pregnant and if she needs to use BUM. Advised patient will check with Dr.Lathrop and return call.   Notes Recorded by Bennye Almracy H Lathrop, MD on 11/20/2013 at 9:11 AM Inform FSH is high and c/w menopause, can stay off ocp and watch for bleeding-may still have occasional cycle, also to call for menopausal symptoms-hot flashes, night sweats, etc

## 2013-11-25 NOTE — Telephone Encounter (Signed)
Spoke with patient. Advised of message as seen below from Dr.Lathrop. Patient is agreeable and verbalizes understanding.  Routing to provider for final review. Patient agreeable to disposition. Will close encounter

## 2013-12-06 ENCOUNTER — Ambulatory Visit: Payer: BC Managed Care – PPO | Attending: Family Medicine | Admitting: Physical Therapy

## 2013-12-06 DIAGNOSIS — M25552 Pain in left hip: Secondary | ICD-10-CM | POA: Insufficient documentation

## 2013-12-13 ENCOUNTER — Ambulatory Visit: Payer: BC Managed Care – PPO | Admitting: Physical Therapy

## 2013-12-13 DIAGNOSIS — M25552 Pain in left hip: Secondary | ICD-10-CM | POA: Diagnosis not present

## 2013-12-16 ENCOUNTER — Encounter: Payer: Self-pay | Admitting: Gynecology

## 2014-01-07 ENCOUNTER — Ambulatory Visit: Payer: BC Managed Care – PPO | Admitting: Cardiology

## 2014-01-13 ENCOUNTER — Encounter: Payer: Self-pay | Admitting: Cardiology

## 2014-01-13 ENCOUNTER — Ambulatory Visit (INDEPENDENT_AMBULATORY_CARE_PROVIDER_SITE_OTHER): Payer: BC Managed Care – PPO | Admitting: Cardiology

## 2014-01-13 VITALS — BP 100/77 | HR 62 | Ht 59.0 in | Wt 141.8 lb

## 2014-01-13 DIAGNOSIS — E669 Obesity, unspecified: Secondary | ICD-10-CM

## 2014-01-13 DIAGNOSIS — G4733 Obstructive sleep apnea (adult) (pediatric): Secondary | ICD-10-CM

## 2014-01-13 NOTE — Patient Instructions (Addendum)
Your physician recommends that you continue on your current medications as directed. Please refer to the Current Medication list given to you today.  Your physician wants you to follow-up in: 6 months with Dr Turner You will receive a reminder letter in the mail two months in advance. If you don't receive a letter, please call our office to schedule the follow-up appointment.  

## 2014-01-13 NOTE — Progress Notes (Signed)
[address] [place], [place]  [address] Phone: [phone] Fax:  [phone]  Date:  01/13/2014   ID:  Suzanne Coderaula A Schmidt, DOB 1960/09/21, MRN [id number]  PCP:  Gweneth DimitriMCNEILL,WENDY, MD  Cardiologist:  Armanda Magicraci Turner, MD    History of Present Illness: Suzanne Breachaula G Mcclurg is a 53 y.o. female with a history of OSA and obesity who presents today for followup. She is doing well. She has had a cold recently and has not been able to use her device that last few nights.  She tolerates her CPAP device without any problems. She tolerates the nasal pillow mask and feels the pressure is adequate. Her husband says that she snores a little.   She feels rested in the am and has no daytime sleepiness.  She has tried to get back into walking and [place] [employer]     Wt Readings from Last 3 Encounters:  01/13/14 141 lb 12.8 oz (64.32 kg)  07/19/13 144 lb (65.318 kg)  04/30/13 143 lb (64.864 kg)     Past Medical History  Diagnosis Date  . Sleep apnea   . Migraine without aura   . OSA (obstructive sleep apnea) 05/27/2007    PSG w RDI 16.3 and AHI 4.52   . Hypertriglyceridemia   . Obesity     Current Outpatient Prescriptions  Medication Sig Dispense Refill  . Aspirin-Acetaminophen-Caffeine (MIGRAINE FORMULA PO) Take by mouth.    Marland Kitchen. atenolol (TENORMIN) 100 MG tablet Take 100 mg by mouth daily.    . Calcium Carbonate (CALCIUM 600 PO) Take by mouth.    . Cholecalciferol (VITAMIN D-3) 1000 UNITS CAPS Take by mouth.    . fish oil-omega-3 fatty acids 1000 MG capsule Take 2 g by mouth daily.    . meloxicam (MOBIC) 15 MG tablet Take 15 mg by mouth daily.  1  . Multiple Vitamin (MULTIVITAMIN) capsule Take 1 capsule by mouth daily.    Marland Kitchen. UNABLE TO FIND CPAP    . rizatriptan (MAXALT) 10 MG tablet Take 10 mg by mouth as needed for migraine. May repeat in 2 hours if needed     No current facility-administered medications for this visit.    Allergies:   No Known  Allergies  Social History:  The patient  reports that she has never smoked. She has never used smokeless tobacco. She reports that she does not drink alcohol or use illicit drugs.   Family History:  The patient's family history includes Hypertension in her brother and father; Macular degeneration in her father; Osteoporosis in her mother; Prostate cancer in her father.   ROS:  Please see the history of present illness.      All other systems reviewed and negative.   PHYSICAL EXAM: VS:  BP 100/77 mmHg  Pulse 62  Ht 4\' 11"  (1.499 m)  Wt 141 lb 12.8 oz (64.32 kg)  BMI 28.62 kg/m2  LMP 02/14/2006 Well nourished, well developed, in no acute distress HEENT: normal Neck: no JVD Cardiac:  normal S1, S2; RRR; no murmur Lungs:  clear to auscultation bilaterally, no wheezing, rhonchi or rales Abd: soft, nontender, no hepatomegaly Ext: no edema Skin: warm and dry Neuro:  CNs 2-12 intact, no focal abnormalities noted  ASSESSMENT AND PLAN:  1. OSA on CPAP therapy and tolerating well. She will continue on current settings.  I will get a download from her DME 2. Obesity - I have encouraged her to increase her exercise to try  to lose some weight  Followup with me in 6 months      Signed, Armanda Magicraci Turner, MD [facility] 01/13/2014 1:39 PM

## 2014-04-10 ENCOUNTER — Encounter: Payer: Self-pay | Admitting: Cardiology

## 2014-07-15 ENCOUNTER — Ambulatory Visit: Payer: Self-pay | Admitting: Cardiology

## 2014-07-17 NOTE — Progress Notes (Signed)
Cardiology Office Note   Date:  07/18/2014   ID:  Suzanne Schmidt, DOB 1960/04/07, MRN [id number]  PCP:  Gweneth DimitriMCNEILL,WENDY, MD    Chief Complaint  Patient presents with  . Follow-up    OSA      History of Present Illness: Suzanne Schmidt is a 54 y.o. female with a history of OSA and obesity who presents today for followup. She is doing well. She tolerates her CPAP device without any problems. She tolerates the nasal pillow mask and feels the pressure is adequate. She does not think that she snores. She dose wake up more frequently at night now and sometimes has a hard time going back to sleep.  She feels rested in the am and has no daytime sleepiness when she has slept well.      Past Medical History  Diagnosis Date  . Sleep apnea   . Migraine without aura   . OSA (obstructive sleep apnea) 05/27/2007    PSG w RDI 16.3 and AHI 4.52   . Hypertriglyceridemia   . Obesity     Past Surgical History  Procedure Laterality Date  . Cholecystectomy    . Cesarean section      x2  . Endometrial ablation  2007    HTA  . Colon surgery       Current Outpatient Prescriptions  Medication Sig Dispense Refill  . Aspirin-Acetaminophen-Caffeine (MIGRAINE FORMULA PO) Take by mouth.    Marland Kitchen. atenolol (TENORMIN) 100 MG tablet Take 100 mg by mouth daily.    . Calcium Carbonate (CALCIUM 600 PO) Take by mouth.    . Cholecalciferol (VITAMIN D-3) 1000 UNITS CAPS Take by mouth.    . fish oil-omega-3 fatty acids 1000 MG capsule Take 2 g by mouth daily.    . meloxicam (MOBIC) 15 MG tablet Take 15 mg by mouth as needed for pain.   1  . Multiple Vitamin (MULTIVITAMIN) capsule Take 1 capsule by mouth daily.    . rizatriptan (MAXALT) 10 MG tablet Take 10 mg by mouth as needed for migraine. May repeat in 2 hours if needed    . UNABLE TO FIND CPAP     No current facility-administered medications for this visit.    Allergies:   Review of patient's allergies indicates no known  allergies.    Social History:  The patient  reports that she has never smoked. She has never used smokeless tobacco. She reports that she does not drink alcohol or use illicit drugs.   Family History:  The patient's family history includes Hypertension in her brother and father; Macular degeneration in her father; Osteoporosis in her mother; Prostate cancer in her father.    ROS:  Please see the history of present illness.   Otherwise, review of systems are positive for none.   All other systems are reviewed and negative.    PHYSICAL EXAM: VS:  BP 98/66 mmHg  Pulse 72  Ht 4\' 11"  (1.499 m)  Wt 140 lb 12.8 oz (63.866 kg)  BMI 28.42 kg/m2  SpO2 97%  LMP 02/14/2006 , BMI Body mass index is 28.42 kg/(m^2). GEN: Well nourished, well developed, in no acute distress HEENT: normal Neck: no JVD, carotid bruits, or masses Cardiac: RRR; no murmurs, rubs, or gallops,no edema  Respiratory:  clear to auscultation bilaterally, normal work of breathing GI: soft, nontender, nondistended, + BS MS: no deformity or atrophy Skin: warm  and dry, no rash Neuro:  Strength and sensation are intact Psych: euthymic mood, full affect   EKG:  EKG is not ordered today.   Recent Labs: No results found for requested labs within last 365 days.    Lipid Panel No results found for: CHOL, TRIG, HDL, CHOLHDL, VLDL, LDLCALC, LDLDIRECT    Wt Readings from Last 3 Encounters:  07/18/14 140 lb 12.8 oz (63.866 kg)  01/13/14 141 lb 12.8 oz (64.32 kg)  07/19/13 144 lb (65.318 kg)     ASSESSMENT AND PLAN:  1. OSA on CPAP therapy and tolerating well. She will continue on current settings. Her d/l today showed an AHI 3/hr at 12cm H2O and 92% compliance in using more than 4 hours nightly.   2. Obesity - I have encouraged her to increase her exercise to try to lose some weight   Current medicines are reviewed at length with the patient today.  The patient does not have concerns regarding medicines.  The  following changes have been made:  no change  Labs/ tests ordered today: See above Assessment and Plan No orders of the defined types were placed in this encounter.     Disposition:   FU with me in 1 year  Signed, Quintella Reichert, MD  07/18/2014 8:08 AM    [facility] [address] [place], [place], [place]  [address] Phone: [phone]; Fax: [phone]

## 2014-07-18 ENCOUNTER — Ambulatory Visit (INDEPENDENT_AMBULATORY_CARE_PROVIDER_SITE_OTHER): Payer: Self-pay | Admitting: Cardiology

## 2014-07-18 ENCOUNTER — Encounter: Payer: Self-pay | Admitting: Cardiology

## 2014-07-18 VITALS — BP 98/66 | HR 72 | Ht 59.0 in | Wt 140.8 lb

## 2014-07-18 DIAGNOSIS — G4733 Obstructive sleep apnea (adult) (pediatric): Secondary | ICD-10-CM

## 2014-07-18 DIAGNOSIS — E669 Obesity, unspecified: Secondary | ICD-10-CM

## 2014-07-18 NOTE — Patient Instructions (Signed)

## 2014-07-28 ENCOUNTER — Ambulatory Visit: Payer: BC Managed Care – PPO | Admitting: Gynecology

## 2014-07-31 ENCOUNTER — Encounter: Payer: Self-pay | Admitting: Cardiology

## 2014-10-01 ENCOUNTER — Encounter: Payer: Self-pay | Admitting: *Deleted

## 2014-11-20 ENCOUNTER — Telehealth: Payer: Self-pay | Admitting: *Deleted

## 2014-11-20 NOTE — Telephone Encounter (Signed)
Release of Information received from new provider for records.  Records have been sent to new provider.  Amy at [facility] notified that we are no longer physician of record for this patient.  Amy removed our name from their system.    Patient is removed from current [facility] recall.  Routing to provider for final review.  Closing encounter.

## 2015-07-29 ENCOUNTER — Ambulatory Visit: Payer: Self-pay | Admitting: Cardiology

## 2015-09-03 ENCOUNTER — Encounter: Payer: Self-pay | Admitting: Cardiology

## 2015-10-16 ENCOUNTER — Ambulatory Visit: Payer: Self-pay | Admitting: Cardiology

## 2015-11-18 ENCOUNTER — Ambulatory Visit (INDEPENDENT_AMBULATORY_CARE_PROVIDER_SITE_OTHER): Payer: 59 | Admitting: Cardiology

## 2015-11-18 ENCOUNTER — Encounter: Payer: Self-pay | Admitting: Cardiology

## 2015-11-18 VITALS — BP 114/70 | HR 76 | Ht 59.0 in | Wt 136.4 lb

## 2015-11-18 DIAGNOSIS — G4733 Obstructive sleep apnea (adult) (pediatric): Secondary | ICD-10-CM

## 2015-11-18 DIAGNOSIS — E6609 Other obesity due to excess calories: Secondary | ICD-10-CM

## 2015-11-18 NOTE — Patient Instructions (Signed)

## 2015-11-18 NOTE — Progress Notes (Signed)
Cardiology Office Note    Date:  11/18/2015   ID:  Suzanne Schmidt, DOB 07/16/1960, MRN [id number]  PCP:  Gweneth DimitriMCNEILL,WENDY, MD  Cardiologist:  Armanda Magicraci Mrytle Bento, MD   Chief Complaint  Patient presents with  . Sleep Apnea    History of Present Illness:  Suzanne Schmidt is a 55 y.o. female with a history of OSA and obesity who presents today for followup. She is doing well. She tolerates her CPAP device without any problems. She tolerates the nasal pillow mask and feels the pressure is adequate. She does not think that she snores. She feels rested in the am and has no daytime sleepiness when she has slept well.      Past Medical History:  Diagnosis Date  . Hypertriglyceridemia   . Migraine without aura   . Obesity   . OSA (obstructive sleep apnea) 05/27/2007   PSG w RDI 16.3 and AHI 4.52   . Sleep apnea     Past Surgical History:  Procedure Laterality Date  . CESAREAN SECTION     x2  . CHOLECYSTECTOMY    . COLON SURGERY    . ENDOMETRIAL ABLATION  2007   HTA    Current Medications: Outpatient Medications Prior to Visit  Medication Sig Dispense Refill  . Aspirin-Acetaminophen-Caffeine (MIGRAINE FORMULA PO) Take by mouth.    Marland Kitchen. atenolol (TENORMIN) 100 MG tablet Take 75 mg by mouth daily.     . Calcium Carbonate (CALCIUM 600 PO) Take by mouth.    . Cholecalciferol (VITAMIN D-3) 1000 UNITS CAPS Take by mouth.    . meloxicam (MOBIC) 15 MG tablet Take 15 mg by mouth as needed for pain.   1  . Multiple Vitamin (MULTIVITAMIN) capsule Take 1 capsule by mouth daily.    . rizatriptan (MAXALT) 10 MG tablet Take 10 mg by mouth as needed for migraine. May repeat in 2 hours if needed    . UNABLE TO FIND CPAP    . fish oil-omega-3 fatty acids 1000 MG capsule Take 2 g by mouth daily.     No facility-administered medications prior to visit.      Allergies:   Review of patient's allergies indicates no known allergies.   Social History   Social History  . Marital status: Married   Spouse name: N/A  . Number of children: N/A  . Years of education: N/A   Social History Main Topics  . Smoking status: Never Smoker  . Smokeless tobacco: Never Used  . Alcohol use No  . Drug use: No  . Sexual activity: Yes    Partners: Male    Birth control/ protection: Pill   Other Topics Concern  . None   Social History Narrative  . None     Family History:  The patient's family history includes Hypertension in her brother and father; Macular degeneration in her father; Osteoporosis in her mother; Prostate cancer in her father.   ROS:   Please see the history of present illness.    ROS All other systems reviewed and are negative.  No flowsheet data found.     PHYSICAL EXAM:   VS:  BP 114/70   Pulse 76   Ht 4\' 11"  (1.499 m)   Wt 136 lb 6.4 oz (61.9 kg)   LMP 02/14/2006   SpO2 98%   BMI 27.55 kg/m    GEN: Well nourished, well developed, in no acute distress  HEENT: normal  Neck: no JVD, carotid bruits, or masses Cardiac: RRR;  no murmurs, rubs, or gallops,no edema.  Intact distal pulses bilaterally.  Respiratory:  clear to auscultation bilaterally, normal work of breathing GI: soft, nontender, nondistended, + BS MS: no deformity or atrophy  Skin: warm and dry, no rash Neuro:  Alert and Oriented x 3, Strength and sensation are intact Psych: euthymic mood, full affect  Wt Readings from Last 3 Encounters:  11/18/15 136 lb 6.4 oz (61.9 kg)  07/18/14 140 lb 12.8 oz (63.9 kg)  01/13/14 141 lb 12.8 oz (64.3 kg)      Studies/Labs Reviewed:   EKG:  EKG is not ordered today.    Recent Labs: No results found for requested labs within last 8760 hours.   Lipid Panel No results found for: CHOL, TRIG, HDL, CHOLHDL, VLDL, LDLCALC, LDLDIRECT  Additional studies/ records that were reviewed today include:  CPAP download    ASSESSMENT:    1. Obstructive sleep apnea   2. Class 1 obesity due to excess calories with serious comorbidity in adult, unspecified BMI       PLAN:  In order of problems listed above:  OSA - the patient is tolerating PAP therapy well without any problems. The PAP download was reviewed today and showed an AHI of 2.4/hr on 12 cm H2O with 98% compliance in using more than 4 hours nightly.  The patient has been using and benefiting from CPAP use and will continue to benefit from therapy.  Obesity - I have encouraged her to get back into walking which she plans to do.     Medication Adjustments/Labs and Tests Ordered: Current medicines are reviewed at length with the patient today.  Concerns regarding medicines are outlined above.  Medication changes, Labs and Tests ordered today are listed in the Patient Instructions below.  There are no Patient Instructions on file for this visit.   Signed, Armanda Magic, MD  11/18/2015 2:08 PM    [facility] [address] [place], [place], [place]  [address] Phone: [phone]; Fax: [phone]

## 2016-09-23 ENCOUNTER — Telehealth: Payer: Self-pay | Admitting: *Deleted

## 2016-09-23 NOTE — Telephone Encounter (Signed)
Patient called to say her appointment scheduled for 12/20/16 at 7:40 would not work for her so her appointment was moved to 12/21/16 at 1:20 which was a better fit. Patient agrees to the change.

## 2016-12-05 ENCOUNTER — Ambulatory Visit: Payer: 59 | Admitting: Cardiology

## 2016-12-06 ENCOUNTER — Encounter: Payer: Self-pay | Admitting: Cardiology

## 2016-12-20 ENCOUNTER — Ambulatory Visit: Payer: 59 | Admitting: Cardiology

## 2016-12-21 ENCOUNTER — Ambulatory Visit (INDEPENDENT_AMBULATORY_CARE_PROVIDER_SITE_OTHER): Payer: 59 | Admitting: Cardiology

## 2016-12-21 ENCOUNTER — Encounter: Payer: Self-pay | Admitting: Cardiology

## 2016-12-21 VITALS — BP 110/74 | HR 73 | Ht 58.5 in | Wt 139.0 lb

## 2016-12-21 DIAGNOSIS — G4733 Obstructive sleep apnea (adult) (pediatric): Secondary | ICD-10-CM

## 2016-12-21 NOTE — Progress Notes (Signed)
Cardiology Office Note:    Date:  12/21/2016   ID:  Suzanne Schmidt, DOB [birth year], MRN [id number]  PCP:  Gweneth DimitriMcNeill, Wendy, MD  Cardiologist:  Armanda Magicraci Shantese Raven, MD   Referring MD: Gweneth DimitriMcNeill, Wendy, MD   Chief Complaint  Patient presents with  . Sleep Apnea    History of Present Illness:    Suzanne Schmidt is a 56 y.o. female with a hx of OSA and obesity.She is doing well with her CPAP device .  She tolerates the [place] nasal mask and feels the pressure is adequate.  She is waking up at night with a very dry mouth and thinks that she is mouth breathing.  Since going on CPAP she feels rested in the am and has no significant daytime sleepiness.   She does not think that he snores.  She does have some problems waking up at night due to her mind racing.  Her machine is over 58 years old and she would like a new one.    Past Medical History:  Diagnosis Date  . Hypertriglyceridemia   . Migraine without aura   . Obesity   . OSA (obstructive sleep apnea) 05/27/2007   PSG w RDI 16.3 and AHI 4.52   . Sleep apnea     Past Surgical History:  Procedure Laterality Date  . CESAREAN SECTION     x2  . CHOLECYSTECTOMY    . COLON SURGERY    . ENDOMETRIAL ABLATION  2007   HTA    Current Medications: Current Meds  Medication Sig  . Aspirin-Acetaminophen-Caffeine (MIGRAINE FORMULA PO) Take by mouth.  Marland Kitchen. atenolol (TENORMIN) 100 MG tablet Take 75 mg by mouth daily.   . Calcium Carbonate (CALCIUM 600 PO) Take by mouth.  . Cholecalciferol (VITAMIN D-3) 1000 UNITS CAPS Take by mouth.  . meloxicam (MOBIC) 15 MG tablet Take 15 mg by mouth as needed for pain.   . Multiple Vitamin (MULTIVITAMIN) capsule Take 1 capsule by mouth daily.  . rizatriptan (MAXALT) 10 MG tablet Take 10 mg by mouth as needed for migraine. May repeat in 2 hours if needed  . tolterodine (DETROL) 2 MG tablet Take 2 mg daily by mouth.  Marland Kitchen. UNABLE TO FIND CPAP  . XIIDRA 5 % SOLN Place 1 drop 2 (two) times daily into both eyes.      Allergies:   Patient has no known allergies.   Social History   Socioeconomic History  . Marital status: Married    Spouse name: None  . Number of children: None  . Years of education: None  . Highest education level: None  Social Needs  . Financial resource strain: None  . Food insecurity - worry: None  . Food insecurity - inability: None  . Transportation needs - medical: None  . Transportation needs - non-medical: None  Occupational History  . None  Tobacco Use  . Smoking status: Never Smoker  . Smokeless tobacco: Never Used  Substance and Sexual Activity  . Alcohol use: No  . Drug use: No  . Sexual activity: Yes    Partners: Male    Birth control/protection: Pill  Other Topics Concern  . None  Social History Narrative  . None     Family History: The patient's family history includes Hypertension in her brother and father; Macular degeneration in her father; Osteoporosis in her mother; Prostate cancer in her father.  ROS:   Please see the history of present illness.    ROS  All other systems  reviewed and negative.   EKGs/Labs/Other Studies Reviewed:    The following studies were reviewed today: CPAP dowload  EKG:  EKG is not ordered today.   Recent Labs: No results found for requested labs within last 8760 hours.   Recent Lipid Panel No results found for: CHOL, TRIG, HDL, CHOLHDL, VLDL, LDLCALC, LDLDIRECT  Physical Exam:    VS:  BP 110/74   Pulse 73   Ht 4' 10.5" (1.486 m)   Wt 139 lb (63 kg)   LMP 02/14/2006   SpO2 99%   BMI 28.56 kg/m     Wt Readings from Last 3 Encounters:  12/21/16 139 lb (63 kg)  11/18/15 136 lb 6.4 oz (61.9 kg)  07/18/14 140 lb 12.8 oz (63.9 kg)     GEN:  Well nourished, well developed in no acute distress HEENT: Normal NECK: No JVD; No carotid bruits LYMPHATICS: No lymphadenopathy CARDIAC: RRR, no murmurs, rubs, gallops RESPIRATORY:  Clear to auscultation without rales, wheezing or rhonchi  ABDOMEN: Soft,  non-tender, non-distended MUSCULOSKELETAL:  No edema; No deformity  SKIN: Warm and dry NEUROLOGIC:  Alert and oriented x 3 PSYCHIATRIC:  Normal affect   ASSESSMENT:    1. Obstructive sleep apnea    PLAN:    In order of problems listed above:  1.  OSA - the patient is tolerating PAP therapy well without any problems. The PAP download was reviewed today and showed an AHI of 2.9/hr on 12 cm H2O with 90% compliance in using more than 4 hours nightly.  The patient has been using and benefiting from CPAP use and will continue to benefit from therapy. She would like a new machine so I will order her a new Resmed CPAP with heated humidity at CPAP of 12cm H2O and chin strap added.      Medication Adjustments/Labs and Tests Ordered: Current medicines are reviewed at length with the patient today.  Concerns regarding medicines are outlined above.  No orders of the defined types were placed in this encounter.  No orders of the defined types were placed in this encounter.   Signed, Armanda Magicraci Rasheema Truluck, MD  12/21/2016 1:32 PM    [facility]

## 2016-12-21 NOTE — Patient Instructions (Signed)
Medication Instructions:  Your physician recommends that you continue on your current medications as directed. Please refer to the Current Medication list given to you today.  Labwork: None ordered  Testing/Procedures: None ordered   Follow-Up: Your physician wants you to follow-up in: 1 year with Dr. Mayford Knifeurner. You will receive a reminder letter in the mail two months in advance. If you don't receive a letter, please call our office to schedule the follow-up appointment.  Any Other Special Instructions Will Be Listed Below (If Applicable).  Order placed for CPAP with chin strap. You will receive a call with further instructions in a few days. Call Coralee Northina, CPAP assisstant at [phone] if you don't hear anything within the next week.     If you need a refill on your cardiac medications before your next appointment, please call your pharmacy.

## 2016-12-22 ENCOUNTER — Telehealth: Payer: Self-pay | Admitting: *Deleted

## 2016-12-22 NOTE — Telephone Encounter (Signed)
Supplies order sent to [facility] 

## 2016-12-22 NOTE — Telephone Encounter (Signed)
-----   Message from [place], RN sent at 12/21/2016  1:47 PM EST ----- Regarding: DME CPAP New order for CPAP and chin strap. Thanks

## 2018-06-06 ENCOUNTER — Ambulatory Visit: Payer: 59 | Admitting: Cardiology

## 2018-06-14 ENCOUNTER — Telehealth: Payer: Self-pay | Admitting: *Deleted

## 2018-06-14 ENCOUNTER — Other Ambulatory Visit: Payer: Self-pay

## 2018-06-14 ENCOUNTER — Telehealth: Payer: Self-pay

## 2018-06-14 ENCOUNTER — Telehealth (INDEPENDENT_AMBULATORY_CARE_PROVIDER_SITE_OTHER): Payer: 59 | Admitting: Cardiology

## 2018-06-14 ENCOUNTER — Encounter: Payer: Self-pay | Admitting: Cardiology

## 2018-06-14 VITALS — BP 128/82 | HR 81 | Ht 59.0 in | Wt 135.0 lb

## 2018-06-14 DIAGNOSIS — G4733 Obstructive sleep apnea (adult) (pediatric): Secondary | ICD-10-CM | POA: Diagnosis not present

## 2018-06-14 NOTE — Telephone Encounter (Signed)
-----   Message from Dustin Flock, RN sent at 06/14/2018  4:25 PM EDT ----- Dr. Mayford Knife wants a download from DME.

## 2018-06-14 NOTE — Progress Notes (Signed)
Virtual Visit via Video Note   This visit type was conducted due to national recommendations for restrictions regarding the COVID-19 Pandemic (e.g. social distancing) in an effort to limit this patient's exposure and mitigate transmission in our community.  Due to her co-morbid illnesses, this patient is at least at moderate risk for complications without adequate follow up.  This format is felt to be most appropriate for this patient at this time.  All issues noted in this document were discussed and addressed.  A limited physical exam was performed with this format.  Please refer to the patient's chart for her consent to telehealth for [facility].   Evaluation Performed:  Follow-up visit  This visit type was conducted due to national recommendations for restrictions regarding the COVID-19 Pandemic (e.g. social distancing).  This format is felt to be most appropriate for this patient at this time.  All issues noted in this document were discussed and addressed.  No physical exam was performed (except for noted visual exam findings with Video Visits).  Please refer to the patient's chart (MyChart message for video visits and phone note for telephone visits) for the patient's consent to telehealth for [facility].  Date:  06/14/2018   ID:  Suzanne Schmidt, DOB Feb 10, 1961, MRN [id number]  Patient Location:  Home  [place] location[place]   [place]  PCP:  Gweneth DimitriMcNeill, Wendy, MD  Cardiologist:  Armanda Magicraci Talyah Seder, MD Electrophysiologist:  None   Chief Complaint:  OSA  History of Present Illness:    Suzanne Schmidt is a 58 y.o. female who presents via audio/video conferencing for a telehealth visit today.    Suzanne Schmidt is a 58 y.o. female with a hx of OSA and obesity.She is doing well with her CPAP device .She is doing well with her CPAP device and thinks that she has gotten used to it.  She tolerates the mask and feels the pressure is adequate.  Since going on CPAP she feels rested in the am  and has no significant daytime sleepiness.  She denies any significant mouth or nasal dryness or nasal congestion.  She does not think that he snores.    The patient does not have symptoms concerning for COVID-19 infection (fever, chills, cough, or new shortness of breath).    Prior CV studies:   The following studies were reviewed today:  PAP compliance download  Past Medical History:  Diagnosis Date  . Hypertriglyceridemia   . Migraine without aura   . Obesity   . OSA (obstructive sleep apnea) 05/27/2007   PSG w RDI 16.3 and AHI 4.52   . Sleep apnea    Past Surgical History:  Procedure Laterality Date  . CESAREAN SECTION     x2  . CHOLECYSTECTOMY    . COLON SURGERY    . ENDOMETRIAL ABLATION  2007   HTA     Current Meds  Medication Sig  . Aspirin-Acetaminophen-Caffeine (MIGRAINE FORMULA PO) Take by mouth.  Marland Kitchen. atenolol (TENORMIN) 100 MG tablet Take 75 mg by mouth daily.   . Calcium Carbonate (CALCIUM 600 PO) Take by mouth 2 (two) times a day.   . Multiple Vitamin (MULTIVITAMIN) capsule Take 1 capsule by mouth daily.  . rizatriptan (MAXALT) 10 MG tablet Take 10 mg by mouth as needed for migraine. May repeat in 2 hours if needed  . UNABLE TO FIND CPAP     Allergies:   Patient has no known allergies.   Social History   Tobacco Use  .  Smoking status: Never Smoker  . Smokeless tobacco: Never Used  Substance Use Topics  . Alcohol use: No  . Drug use: No     Family Hx: The patient's family history includes Hypertension in her brother and father; Macular degeneration in her father; Osteoporosis in her mother; Prostate cancer in her father.  ROS:   Please see the history of present illness.     All other systems reviewed and are negative.   Labs/Other Tests and Data Reviewed:    Recent Labs: No results found for requested labs within last 8760 hours.   Recent Lipid Panel No results found for: CHOL, TRIG, HDL, CHOLHDL, LDLCALC, LDLDIRECT  Wt Readings from Last  3 Encounters:  06/14/18 135 lb (61.2 kg)  12/21/16 139 lb (63 kg)  11/18/15 136 lb 6.4 oz (61.9 kg)     Objective:    Vital Signs:  BP 128/82   Pulse 81   Ht 4\' 11"  (1.499 m)   Wt 135 lb (61.2 kg)   LMP 02/14/2006   BMI 27.27 kg/m    CONSTITUTIONAL:  Well nourished, well developed female in no acute distress.  EYES: anicteric MOUTH: oral mucosa is pink RESPIRATORY: Normal respiratory effort, symmetric expansion CARDIOVASCULAR: No peripheral edema SKIN: No rash, lesions or ulcers MUSCULOSKELETAL: no digital cyanosis NEURO: Cranial Nerves II-XII grossly intact, moves all extremities PSYCH: Intact judgement and insight.  A&O x 3, Mood/affect appropriate   ASSESSMENT & PLAN:    1.  OSA - the patient is tolerating PAP therapy well without any problems. I will get a download from her DME.  The patient has been using and benefiting from PAP use and will continue to benefit from therapy.   COVID-19 Education: The signs and symptoms of COVID-19 were discussed with the patient and how to seek care for testing (follow up with PCP or arrange E-visit).  The importance of social distancing was discussed today.  Patient Risk:   After full review of this patient's clinical status, I feel that they are at least moderate risk at this time.  Time:   Today, I have spent 15 minutes directly with the patient on video discussing medical problems including OSA.  We also reviewed the symptoms of COVID 19 and the ways to protect against contracting the virus with telehealth technology.    Medication Adjustments/Labs and Tests Ordered: Current medicines are reviewed at length with the patient today.  Concerns regarding medicines are outlined above.  Tests Ordered: No orders of the defined types were placed in this encounter.  Medication Changes: No orders of the defined types were placed in this encounter.   Disposition:  Follow up in 1 year(s)  Signed, Armanda Magic, MD  06/14/2018 2:53 PM     [facility]

## 2018-06-14 NOTE — Telephone Encounter (Signed)
Virtual Visit Pre-Appointment Phone Call  "(Name), I am calling you today to discuss your upcoming appointment. We are currently trying to limit exposure to the virus that causes COVID-19 by seeing patients at home rather than in the office."  1. "What is the BEST phone number to call the day of the visit?" - include this in appointment notes  2. "Do you have or have access to (through a family member/friend) a smartphone with video capability that we can use for your visit?" a. If yes - list this number in appt notes as "cell" (if different from BEST phone #) and list the appointment type as a VIDEO visit in appointment notes b. If no - list the appointment type as a PHONE visit in appointment notes  3. Confirm consent - "In the setting of the current Covid19 crisis, you are scheduled for a (phone or video) visit with your provider on (date) at (time).  Just as we do with many in-office visits, in order for you to participate in this visit, we must obtain consent.  If you'd like, I can send this to your mychart (if signed up) or email for you to review.  Otherwise, I can obtain your verbal consent now.  All virtual visits are billed to your insurance company just like a normal visit would be.  By agreeing to a virtual visit, we'd like you to understand that the technology does not allow for your provider to perform an examination, and thus may limit your provider's ability to fully assess your condition. If your provider identifies any concerns that need to be evaluated in person, we will make arrangements to do so.  Finally, though the technology is pretty good, we cannot assure that it will always work on either your or our end, and in the setting of a video visit, we may have to convert it to a phone-only visit.  In either situation, we cannot ensure that we have a secure connection.  Are you willing to proceed?" STAFF: Did the patient verbally acknowledge consent to telehealth visit? Document  YES/NO here: YES  4. Advise patient to be prepared - "Two hours prior to your appointment, go ahead and check your blood pressure, pulse, oxygen saturation, and your weight (if you have the equipment to check those) and write them all down. When your visit starts, your provider will ask you for this information. If you have an Apple Watch or Kardia device, please plan to have heart rate information ready on the day of your appointment. Please have a pen and paper handy nearby the day of the visit as well."  5. Give patient instructions for MyChart download to smartphone OR Doximity/Doxy.me as below if video visit (depending on what platform provider is using)  6. Inform patient they will receive a phone call 15 minutes prior to their appointment time (may be from unknown caller ID) so they should be prepared to answer    TELEPHONE CALL NOTE  Suzanne Firemanaula A Paff has been deemed a candidate for a follow-up tele-health visit to limit community exposure during the Covid-19 pandemic. I spoke with the patient via phone to ensure availability of phone/video source, confirm preferred email & phone number, and discuss instructions and expectations.  I reminded Suzanne Schmidt to be prepared with any vital sign and/or heart rhythm information that could potentially be obtained via home monitoring, at the time of her visit. I reminded Suzanne Firemanaula A Carles to expect a phone call prior to  her visit.  Dustin Flock, RN 06/14/2018 8:35 AM   INSTRUCTIONS FOR DOWNLOADING THE MYCHART APP TO SMARTPHONE  - The patient must first make sure to have activated MyChart and know their login information - If Apple, go to [employer] and type in MyChart in the search bar and download the app. If Android, ask patient to go to [employer] and type in [place] in the search bar and download the app. The app is free but as with any other app downloads, their phone may require them to verify saved payment information or Apple/Android  password.  - The patient will need to then log into the app with their MyChart username and password, and select [facility] as their healthcare provider to link the account. When it is time for your visit, go to the MyChart app, find appointments, and click Begin Video Visit. Be sure to Select Allow for your device to access the Microphone and Camera for your visit. You will then be connected, and your provider will be with you shortly.  **If they have any issues connecting, or need assistance please contact MyChart service desk [phone])**  **If using a computer, in order to ensure the best quality for their visit they will need to use either of the following Internet Browsers: [employer] or Google Chrome**  IF USING DOXIMITY or DOXY.ME - The patient will receive a link just prior to their visit by text.     FULL LENGTH CONSENT FOR TELE-HEALTH VISIT   I hereby voluntarily request, consent and authorize [facility] and its employed or contracted physicians, physician assistants, nurse practitioners or other licensed health care professionals (the Practitioner), to provide me with telemedicine health care services (the "Services") as deemed necessary by the treating Practitioner. I acknowledge and consent to receive the Services by the Practitioner via telemedicine. I understand that the telemedicine visit will involve communicating with the Practitioner through live audiovisual communication technology and the disclosure of certain medical information by electronic transmission. I acknowledge that I have been given the opportunity to request an in-person assessment or other available alternative prior to the telemedicine visit and am voluntarily participating in the telemedicine visit.  I understand that I have the right to withhold or withdraw my consent to the use of telemedicine in the course of my care at any time, without affecting my right to future care or treatment,  and that the Practitioner or I may terminate the telemedicine visit at any time. I understand that I have the right to inspect all information obtained and/or recorded in the course of the telemedicine visit and may receive copies of available information for a reasonable fee.  I understand that some of the potential risks of receiving the Services via telemedicine include:  Marland Kitchen Delay or interruption in medical evaluation due to technological equipment failure or disruption; . Information transmitted may not be sufficient (e.g. poor resolution of images) to allow for appropriate medical decision making by the Practitioner; and/or  . In rare instances, security protocols could fail, causing a breach of personal health information.  Furthermore, I acknowledge that it is my responsibility to provide information about my medical history, conditions and care that is complete and accurate to the best of my ability. I acknowledge that Practitioner's advice, recommendations, and/or decision may be based on factors not within their control, such as incomplete or inaccurate data provided by me or distortions of diagnostic images or specimens that may result from electronic transmissions.  I understand that the practice of medicine is not an exact science and that Practitioner makes no warranties or guarantees regarding treatment outcomes. I acknowledge that I will receive a copy of this consent concurrently upon execution via email to the email address I last provided but may also request a printed copy by calling the office of [place].    I understand that my insurance will be billed for this visit.   I have read or had this consent read to me. . I understand the contents of this consent, which adequately explains the benefits and risks of the Services being provided via telemedicine.  . I have been provided ample opportunity to ask questions regarding this consent and the Services and have had my questions  answered to my satisfaction. . I give my informed consent for the services to be provided through the use of telemedicine in my medical care  By participating in this telemedicine visit I agree to the above.

## 2018-06-14 NOTE — Telephone Encounter (Addendum)
[employer] via community message, Please add patient to airview under [facility] or send a download. Thank you

## 2018-06-14 NOTE — Patient Instructions (Signed)

## 2018-11-09 ENCOUNTER — Telehealth: Payer: Self-pay

## 2018-11-09 NOTE — Telephone Encounter (Signed)
I have attempted to contact this patient by phone, but pt's VM is not set up.  I will continue to try later.

## 2018-11-12 ENCOUNTER — Other Ambulatory Visit: Payer: Self-pay

## 2018-11-12 ENCOUNTER — Encounter: Payer: Self-pay | Admitting: Cardiology

## 2018-11-12 ENCOUNTER — Telehealth (INDEPENDENT_AMBULATORY_CARE_PROVIDER_SITE_OTHER): Payer: Self-pay | Admitting: Cardiology

## 2018-11-12 DIAGNOSIS — G4733 Obstructive sleep apnea (adult) (pediatric): Secondary | ICD-10-CM

## 2018-11-12 DIAGNOSIS — I10 Essential (primary) hypertension: Secondary | ICD-10-CM

## 2018-11-12 NOTE — Progress Notes (Signed)
Virtual Visit via Video Note   This visit type was conducted due to national recommendations for restrictions regarding the COVID-19 Pandemic (e.g. social distancing) in an effort to limit this patient's exposure and mitigate transmission in our community.  Due to her co-morbid illnesses, this patient is at least at moderate risk for complications without adequate follow up.  This format is felt to be most appropriate for this patient at this time.  All issues noted in this document were discussed and addressed.  A limited physical exam was performed with this format.  Please refer to the patient's chart for her consent to telehealth for [facility].   Evaluation Performed:  Follow-up visit  This visit type was conducted due to national recommendations for restrictions regarding the COVID-19 Pandemic (e.g. social distancing).  This format is felt to be most appropriate for this patient at this time.  All issues noted in this document were discussed and addressed.  No physical exam was performed (except for noted visual exam findings with Video Visits).  Please refer to the patient's chart (MyChart message for video visits and phone note for telephone visits) for the patient's consent to telehealth for [facility].  Date:  11/12/2018   ID:  Suzanne Schmidt, DOB 29-Apr-1960, MRN [id number]  Patient Location:  [place]  Provider location:   [place]  PCP:  Gweneth Dimitri, MD  Sleep Medicine:  Armanda Magic, MD Electrophysiologist:  None   Chief Complaint:  OSA  History of Present Illness:    Suzanne Schmidt is a 58 y.o. female who presents via audio/video conferencing for a telehealth visit today.    Suzanne Schmidt a 58 y.o.femalewith a hx of OSA and obesity.  She is doing well with her CPAP device and thinks that she has gotten used to it.  She tolerates the mask and feels the pressure is adequate.  Since going on CPAP she feels rested in the am and has no significant daytime  sleepiness.  She denies any significant mouth or nasal dryness or nasal congestion.  She does not think that he snores.    The patient does not have symptoms concerning for COVID-19 infection (fever, chills, cough, or new shortness of breath).    Prior CV studies:   The following studies were reviewed today:  PAP compliance download  Past Medical History:  Diagnosis Date  . Hypertriglyceridemia   . Migraine without aura   . Obesity   . OSA (obstructive sleep apnea) 05/27/2007   PSG w RDI 16.3 and AHI 4.52   . Sleep apnea    Past Surgical History:  Procedure Laterality Date  . CESAREAN SECTION     x2  . CHOLECYSTECTOMY    . COLON SURGERY    . ENDOMETRIAL ABLATION  2007   HTA     Current Meds  Medication Sig  . Aspirin-Acetaminophen-Caffeine (MIGRAINE FORMULA PO) Take by mouth.  Marland Kitchen atenolol (TENORMIN) 100 MG tablet Take 75 mg by mouth daily.   . Calcium Carbonate (CALCIUM 600 PO) Take by mouth 2 (two) times a day.   . Multiple Vitamin (MULTIVITAMIN) capsule Take 1 capsule by mouth daily.  . rizatriptan (MAXALT) 10 MG tablet Take 10 mg by mouth as needed for migraine. May repeat in 2 hours if needed  . UNABLE TO FIND CPAP     Allergies:   Patient has no known allergies.   Social History   Tobacco Use  . Smoking status: Never Smoker  . Smokeless tobacco:  Never Used  Substance Use Topics  . Alcohol use: No  . Drug use: No     Family Hx: The patient's family history includes Hypertension in her brother and father; Macular degeneration in her father; Osteoporosis in her mother; Prostate cancer in her father.  ROS:   Please see the history of present illness.     All other systems reviewed and are negative.   Labs/Other Tests and Data Reviewed:    Recent Labs: No results found for requested labs within last 8760 hours.   Recent Lipid Panel No results found for: CHOL, TRIG, HDL, CHOLHDL, LDLCALC, LDLDIRECT  Wt Readings from Last 3 Encounters:  06/14/18 135 lb  (61.2 kg)  12/21/16 139 lb (63 kg)  11/18/15 136 lb 6.4 oz (61.9 kg)     Objective:    Vital Signs:  LMP 02/14/2006    CONSTITUTIONAL:  Well nourished, well developed female in no acute distress.  EYES: anicteric MOUTH: oral mucosa is pink RESPIRATORY: Normal respiratory effort, symmetric expansion CARDIOVASCULAR: No peripheral edema SKIN: No rash, lesions or ulcers MUSCULOSKELETAL: no digital cyanosis NEURO: Cranial Nerves II-XII grossly intact, moves all extremities PSYCH: Intact judgement and insight.  A&O x 3, Mood/affect appropriate   ASSESSMENT & PLAN:    1.  OSA - The PAP download was reviewed today and showed an AHI of 0.3/hr on 12 cm H2O with 100% compliance in using more than 4 hours nightly.  The patient has been using and benefiting from PAP use and will continue to benefit from therapy.  2. Obesity -  I have encouraged her to get into a routine exercise program and cut back on carbs and portions.    COVID-19 Education: The signs and symptoms of COVID-19 were discussed with the patient and how to seek care for testing (follow up with PCP or arrange E-visit).  The importance of social distancing was discussed today.  Patient Risk:   After full review of this patient's clinical status, I feel that they are at least moderate risk at this time.  Time:   Today, I have spent 10 minutes directly with the patient on telemedicine discussing medical problems including OSA and obesity.  We also reviewed the symptoms of COVID 19 and the ways to protect against contracting the virus with telehealth technology.  I spent an additional 5 minutes reviewing patient's chart including PAP complinace.  Medication Adjustments/Labs and Tests Ordered: Current medicines are reviewed at length with the patient today.  Concerns regarding medicines are outlined above.  Tests Ordered: No orders of the defined types were placed in this encounter.  Medication Changes: No orders of the defined  types were placed in this encounter.   Disposition:  Follow up in 1 year(s)  Signed, Fransico Him, MD  11/12/2018 9:01 AM    [place]

## 2020-01-01 ENCOUNTER — Ambulatory Visit: Payer: Self-pay | Admitting: Cardiology

## 2020-02-12 ENCOUNTER — Ambulatory Visit (INDEPENDENT_AMBULATORY_CARE_PROVIDER_SITE_OTHER): Payer: No Typology Code available for payment source | Admitting: Cardiology

## 2020-02-12 ENCOUNTER — Encounter: Payer: Self-pay | Admitting: Cardiology

## 2020-02-12 ENCOUNTER — Other Ambulatory Visit: Payer: Self-pay

## 2020-02-12 VITALS — BP 132/78 | HR 68 | Ht 59.0 in | Wt 145.8 lb

## 2020-02-12 DIAGNOSIS — I10 Essential (primary) hypertension: Secondary | ICD-10-CM

## 2020-02-12 DIAGNOSIS — G4733 Obstructive sleep apnea (adult) (pediatric): Secondary | ICD-10-CM

## 2020-02-12 NOTE — Progress Notes (Signed)
Date:  02/12/2020   ID:  Suzanne Schmidt, DOB Jan 07, 1961, MRN [id number]   PCP:  Gweneth Dimitri, MD  Sleep Medicine:  Armanda Magic, MD Electrophysiologist:  None   Chief Complaint:  OSA  History of Present Illness:    Suzanne Schmidt is a 59 y.o. femalewith a hx of OSA and obesity.  She is doing well with her CPAP device and thinks that she has gotten used to it.  She tolerates the nasal pillow mask and feels the pressure is adequate.  Since going on CPAP she feels rested in the am and has no significant daytime sleepiness.  She has some problems with dry mouth when she sleeps with her PAP unit at her parents house but not at her house.  She does not think that she snores.    Prior CV studies:   The following studies were reviewed today:  PAP compliance download, EKG  Past Medical History:  Diagnosis Date  . Hypertriglyceridemia   . Migraine without aura   . Obesity   . OSA (obstructive sleep apnea) 05/27/2007   PSG w RDI 16.3 and AHI 4.52   . Sleep apnea    Past Surgical History:  Procedure Laterality Date  . CESAREAN SECTION     x2  . CHOLECYSTECTOMY    . COLON SURGERY    . ENDOMETRIAL ABLATION  2007   HTA     Current Meds  Medication Sig  . Aspirin-Acetaminophen-Caffeine (MIGRAINE FORMULA PO) Take by mouth.  . Calcium Carbonate (CALCIUM 600 PO) Take by mouth 2 (two) times a day.   . Multiple Vitamin (MULTIVITAMIN) capsule Take 1 capsule by mouth daily.  . rizatriptan (MAXALT) 10 MG tablet Take 10 mg by mouth as needed for migraine. May repeat in 2 hours if needed  . UNABLE TO FIND CPAP  . [DISCONTINUED] atenolol (TENORMIN) 100 MG tablet Take 75 mg by mouth daily.      Allergies:   Patient has no known allergies.   Social History   Tobacco Use  . Smoking status: Never Smoker  . Smokeless tobacco: Never Used  Vaping Use  . Vaping Use: Never used  Substance Use Topics  . Alcohol use: No  . Drug use: No     Family Hx: The patient's family history includes  Hypertension in her brother and father; Macular degeneration in her father; Osteoporosis in her mother; Prostate cancer in her father.  ROS:   Please see the history of present illness.     All other systems reviewed and are negative.   Labs/Other Tests and Data Reviewed:    Recent Labs: No results found for requested labs within last 8760 hours.   Recent Lipid Panel No results found for: CHOL, TRIG, HDL, CHOLHDL, LDLCALC, LDLDIRECT  Wt Readings from Last 3 Encounters:  02/12/20 145 lb 12.8 oz (66.1 kg)  06/14/18 135 lb (61.2 kg)  12/21/16 139 lb (63 kg)     Objective:    Vital Signs:  BP 132/78   Pulse 68   Ht 4\' 11"  (1.499 m)   Wt 145 lb 12.8 oz (66.1 kg)   LMP 02/14/2006   SpO2 98%   BMI 29.45 kg/m    GEN: Well nourished, well developed in no acute distress HEENT: Normal NECK: No JVD; No carotid bruits LYMPHATICS: No lymphadenopathy CARDIAC:RRR, no murmurs, rubs, gallops RESPIRATORY:  Clear to auscultation without rales, wheezing or rhonchi  ABDOMEN: Soft, non-tender, non-distended MUSCULOSKELETAL:  No edema; No deformity  SKIN: Warm  and dry NEUROLOGIC:  Alert and oriented x 3 PSYCHIATRIC:  Normal affect   EKG was performed in the office today and showed NSR with nonspecific T wave abnormality and low voltage QRS  ASSESSMENT & PLAN:    1.  OSA - The patient is tolerating PAP therapy well without any problems. The PAP download was reviewed today and showed an AHI of 0.4/hr on 12 cm H2O with 90% compliance in using more than 4 hours nightly.  The patient has been using and benefiting from PAP use and will continue to benefit from therapy.  -I encouraged her to use her chin strap to help with compliance.  2. HTN -BP controlled on exam -continue Atenolol 75mg  daily   Medication Adjustments/Labs and Tests Ordered: Current medicines are reviewed at length with the patient today.  Concerns regarding medicines are outlined above.  Tests Ordered: Orders Placed  This Encounter  Procedures  . EKG 12-Lead   Medication Changes: No orders of the defined types were placed in this encounter.   Disposition:  Follow up in 1 year(s)  Signed, , MD  02/12/2020 12:15 PM    [facility]

## 2020-02-12 NOTE — Patient Instructions (Signed)

## 2020-05-22 ENCOUNTER — Encounter: Payer: Self-pay | Admitting: Cardiology

## 2020-05-22 ENCOUNTER — Ambulatory Visit (INDEPENDENT_AMBULATORY_CARE_PROVIDER_SITE_OTHER): Payer: No Typology Code available for payment source | Admitting: Cardiology

## 2020-05-22 ENCOUNTER — Encounter: Payer: Self-pay | Admitting: *Deleted

## 2020-05-22 ENCOUNTER — Other Ambulatory Visit: Payer: Self-pay

## 2020-05-22 VITALS — BP 120/80 | HR 78 | Ht 59.0 in | Wt 141.6 lb

## 2020-05-22 DIAGNOSIS — R0789 Other chest pain: Secondary | ICD-10-CM | POA: Diagnosis not present

## 2020-05-22 DIAGNOSIS — G4733 Obstructive sleep apnea (adult) (pediatric): Secondary | ICD-10-CM

## 2020-05-22 DIAGNOSIS — I10 Essential (primary) hypertension: Secondary | ICD-10-CM

## 2020-05-22 DIAGNOSIS — R002 Palpitations: Secondary | ICD-10-CM

## 2020-05-22 NOTE — Progress Notes (Signed)
Patient ID: Suzanne Schmidt, female   DOB: 14-Sep-1960, 60 y.o.   MRN: [id number] Patient enrolled for [employer] to ship a 30 day cardiac event monitor to her home.

## 2020-05-22 NOTE — Progress Notes (Signed)
Date:  05/22/2020   ID:  [name], DOB 01/08/1961, MRN [id number]   PCP:  Gweneth Dimitri, MD  Sleep Medicine:  Armanda Magic, MD Electrophysiologist:  None   Chief Complaint:  OSA  History of Present Illness:    Suzanne Schmidt is a 60 y.o. femalewith a hx of OSA and obesity.  I saw her in December for OSA and she was doing well but since then has developed palpitations. She tells me that over the past few weeks she has had problems with palpitations. She says that she also has been experiencing dizziness and will feel like her body is not stable with her surroundings.  It does not always occur at the same time as the fluttering.  The fluttering occurs very sporadically but had been daily but has settled down. She drinks 1 cup coffee daily and no ETOH.  She did had one episode of heaviness in her chest while having palpitations but she has not had any exertional chest discomfort.  Most of her palpitations occur at rest was well.   She denies any chest pain or pressure, SOB, DOE, PND, orthopnea, LE edema or syncope. She is compliant with her meds and is tolerating meds with no SE.    Prior CV studies:   The following studies were reviewed today:  EKG was performed in the office today and showed   Past Medical History:  Diagnosis Date  . Hypertriglyceridemia   . Migraine without aura   . Obesity   . OSA (obstructive sleep apnea) 05/27/2007   PSG w RDI 16.3 and AHI 4.52   . Sleep apnea    Past Surgical History:  Procedure Laterality Date  . CESAREAN SECTION     x2  . CHOLECYSTECTOMY    . COLON SURGERY    . ENDOMETRIAL ABLATION  2007   HTA     Current Meds  Medication Sig  . Aspirin-Acetaminophen-Caffeine (MIGRAINE FORMULA PO) Take by mouth.  Marland Kitchen atenolol (TENORMIN) 25 MG tablet Take 3 tablets by mouth daily.  . Calcium Carbonate (CALCIUM 600 PO) Take by mouth 2 (two) times a day.   . Multiple Vitamin (MULTIVITAMIN) capsule Take 1 capsule by mouth daily.  . rizatriptan  (MAXALT) 10 MG tablet Take 10 mg by mouth as needed for migraine. May repeat in 2 hours if needed  . UNABLE TO FIND CPAP     Allergies:   Patient has no known allergies.   Social History   Tobacco Use  . Smoking status: Never Smoker  . Smokeless tobacco: Never Used  Vaping Use  . Vaping Use: Never used  Substance Use Topics  . Alcohol use: No  . Drug use: No     Family Hx: The patient's family history includes Hypertension in her brother and father; Macular degeneration in her father; Osteoporosis in her mother; Prostate cancer in her father.  ROS:   Please see the history of present illness.     All other systems reviewed and are negative.   Labs/Other Tests and Data Reviewed:    Recent Labs: No results found for requested labs within last 8760 hours.   Recent Lipid Panel No results found for: CHOL, TRIG, HDL, CHOLHDL, LDLCALC, LDLDIRECT  Wt Readings from Last 3 Encounters:  05/22/20 141 lb 9.6 oz (64.2 kg)  02/12/20 145 lb 12.8 oz (66.1 kg)  06/14/18 135 lb (61.2 kg)     Objective:    Vital Signs:  BP 120/80   Pulse 78  Ht 4\' 11"  (1.499 m)   Wt 141 lb 9.6 oz (64.2 kg)   LMP 02/14/2006   SpO2 97%   BMI 28.60 kg/m    GEN: Well nourished, well developed in no acute distress HEENT: Normal NECK: No JVD; No carotid bruits LYMPHATICS: No lymphadenopathy CARDIAC:RRR, no murmurs, rubs, gallops RESPIRATORY:  Clear to auscultation without rales, wheezing or rhonchi  ABDOMEN: Soft, non-tender, non-distended MUSCULOSKELETAL:  No edema; No deformity  SKIN: Warm and dry NEUROLOGIC:  Alert and oriented x 3 PSYCHIATRIC:  Normal affect    ASSESSMENT & PLAN:    1. OSA - The patient is tolerating PAP therapy well without any problems. The PAP download was reviewed today and showed an AHI of 0.5/hr on 12 cm H2O with 100% compliance in using more than 4 hours nightly.  The patient has been using and benefiting from PAP use and will continue to benefit from therapy.    2. HTN -Bp is adequately controlled on exam today -continue Atenolol 75mg  daily  3.  Palpitations -she has been under a lot of stress recently . -I will get a 30 day event monitor to make sure she is not having PAF  4. Chest heaviness -this only occurred once time during palpitations and has not had any other episodes and nothing with exertion -no workup at this time unless it is recurrent  Medication Adjustments/Labs and Tests Ordered: Current medicines are reviewed at length with the patient today.  Concerns regarding medicines are outlined above.  Tests Ordered: Orders Placed This Encounter  Procedures  . EKG 12-Lead   Medication Changes: No orders of the defined types were placed in this encounter.   Disposition:  Follow up in 1 year(s)  Signed, 04/15/2006, MD  05/22/2020 2:06 PM    [facility]

## 2020-05-22 NOTE — Addendum Note (Signed)
Addended by: Dossie Arbour on: 05/22/2020 02:11 PM   Modules accepted: Orders

## 2020-05-22 NOTE — Patient Instructions (Addendum)
Medication Instructions:  Your physician recommends that you continue on your current medications as directed. Please refer to the Current Medication list given to you today.   *If you need a refill on your cardiac medications before your next appointment, please call your pharmacy*   Lab Work: none If you have labs (blood work) drawn today and your tests are completely normal, you will receive your results only by: Marland Kitchen MyChart Message (if you have MyChart) OR . A paper copy in the mail If you have any lab test that is abnormal or we need to change your treatment, we will call you to review the results.   Testing/Procedures: Your physician has recommended that you wear an event monitor. Event monitors are medical devices that record the heart's electrical activity. Doctors most often [place] these monitors to diagnose arrhythmias. Arrhythmias are problems with the speed or rhythm of the heartbeat. The monitor is a small, portable device. You can wear one while you do your normal daily activities. This is usually used to diagnose what is causing palpitations/syncope (passing out).     Follow-Up: At [facility], you and your health needs are our priority.  As part of our continuing mission to provide you with exceptional heart care, we have created designated Provider Care Teams.  These Care Teams include your primary Cardiologist (physician) and Advanced Practice Providers (APPs -  Physician Assistants and Nurse Practitioners) who all work together to provide you with the care you need, when you need it.  We recommend signing up for the patient portal called "MyChart".  Sign up information is provided on this After Visit Summary.  MyChart is used to connect with patients for Virtual Visits (Telemedicine).  Patients are able to view lab/test results, encounter notes, upcoming appointments, etc.  Non-urgent messages can be sent to your provider as well.   To learn more about what you can do with  MyChart, go to [email or web address].    Your next appointment:   12 month(s)  The format for your next appointment:   In Person  Provider:   You may see Dr Mayford Knife  or one of the following Advanced Practice Providers on your designated Care Team:    Ronie Spies, PA-C  Jacolyn Reedy, PA-C    Other Instructions   [employer] Cardiac Event Monitor Instructions Your physician has requested you wear your cardiac event monitor for __30___ days, (1-30). [employer] may call or text to confirm a shipping address. The monitor will be sent to a land address via UPS. [employer] will not ship a monitor to a PO BOX. It typically takes 3-5 days to receive your monitor after it has been enrolled. [employer] will assist with USPS tracking if your package is delayed. The telephone number for [employer] is [phone]. Once you have received your monitor, please review the enclosed instructions. Instruction tutorials can also be viewed under help and settings on the enclosed cell phone. Your monitor has already been registered assigning a specific monitor serial # to you.  Applying the monitor Remove cell phone from case and turn it on. The cell phone works as IT consultant and needs to be within [employer] of you at all times. The cell phone will need to be charged on a daily basis. We recommend you plug the cell phone into the enclosed charger at your bedside table every night.  Monitor batteries: You will receive two monitor batteries labelled #1 and #2. These are your recorders. Plug battery #2 onto the second connection  on the enclosed charger. Keep one battery on the charger at all times. This will keep the monitor battery deactivated. It will also keep it fully charged for when you need to switch your monitor batteries. A small light will be blinking on the battery emblem when it is charging. The light on the battery emblem will remain on when the battery is fully charged.  Open  package of a Monitor strip. Insert battery #1 into black hood on strip and gently squeeze monitor battery onto connection as indicated in instruction booklet. Set aside while preparing skin.  Choose location for your strip, vertical or horizontal, as indicated in the instruction booklet. Shave to remove all hair from location. There cannot be any lotions, oils, powders, or colognes on skin where monitor is to be applied. Wipe skin clean with enclosed Saline wipe. Dry skin completely.  Peel paper labeled #1 off the back of the Monitor strip exposing the adhesive. Place the monitor on the chest in the vertical or horizontal position shown in the instruction booklet. One arrow on the monitor strip must be pointing upward. Carefully remove paper labeled #2, attaching remainder of strip to your skin. Try not to create any folds or wrinkles in the strip as you apply it.  Firmly press and release the circle in the center of the monitor battery. You will hear a small beep. This is turning the monitor battery on. The heart emblem on the monitor battery will light up every 5 seconds if the monitor battery in turned on and connected to the patient securely. Do not push and hold the circle down as this turns the monitor battery off. The cell phone will locate the monitor battery. A screen will appear on the cell phone checking the connection of your monitor strip. This may read poor connection initially but change to good connection within the next minute. Once your monitor accepts the connection you will hear a series of 3 beeps followed by a climbing crescendo of beeps. A screen will appear on the cell phone showing the two monitor strip placement options. Touch the picture that demonstrates where you applied the monitor strip.  Your monitor strip and battery are waterproof. You are able to shower, bathe, or swim with the monitor on. They just ask you do not submerge deeper than 3 feet underwater. We  recommend removing the monitor if you are swimming in a lake, river, or ocean.  Your monitor battery will need to be switched to a fully charged monitor battery approximately once a week. The cell phone will alert you of an action which needs to be made.  On the cell phone, tap for details to reveal connection status, monitor battery status, and cell phone battery status. The green dots indicates your monitor is in good status. A red dot indicates there is something that needs your attention.  To record a symptom, click the circle on the monitor battery. In 30-60 seconds a list of symptoms will appear on the cell phone. Select your symptom and tap save. Your monitor will record a sustained or significant arrhythmia regardless of you clicking the button. Some patients do not feel the heart rhythm irregularities. [employer] will notify us of any serious or critical events.  Refer to instruction booklet for instructions on switching batteries, changing strips, the Do not disturb or Pause features, or any additional questions.  Call [employer] at [phone], to confirm your monitor is transmitting and record your baseline. They will answer any questions you  may have regarding the monitor instructions at that time.  Returning the monitor to [employer] Place all equipment back into blue box. Peel off strip of paper to expose adhesive and close box securely. There is a prepaid UPS shipping label on this box. Drop in a UPS drop box, or at a UPS facility like [employer]. You may also contact [employer] to arrange UPS to pick up monitor package at your home.

## 2020-05-29 ENCOUNTER — Ambulatory Visit (INDEPENDENT_AMBULATORY_CARE_PROVIDER_SITE_OTHER): Payer: No Typology Code available for payment source

## 2020-05-29 DIAGNOSIS — R002 Palpitations: Secondary | ICD-10-CM | POA: Diagnosis not present

## 2021-02-16 DIAGNOSIS — Z01419 Encounter for gynecological examination (general) (routine) without abnormal findings: Secondary | ICD-10-CM | POA: Diagnosis not present

## 2021-02-16 DIAGNOSIS — Z6828 Body mass index (BMI) 28.0-28.9, adult: Secondary | ICD-10-CM | POA: Diagnosis not present

## 2021-10-19 DIAGNOSIS — Z0000 Encounter for general adult medical examination without abnormal findings: Secondary | ICD-10-CM | POA: Diagnosis not present

## 2021-10-19 DIAGNOSIS — G43909 Migraine, unspecified, not intractable, without status migrainosus: Secondary | ICD-10-CM | POA: Diagnosis not present

## 2021-10-19 DIAGNOSIS — M1990 Unspecified osteoarthritis, unspecified site: Secondary | ICD-10-CM | POA: Diagnosis not present

## 2021-10-19 DIAGNOSIS — K219 Gastro-esophageal reflux disease without esophagitis: Secondary | ICD-10-CM | POA: Diagnosis not present

## 2021-10-19 DIAGNOSIS — Z23 Encounter for immunization: Secondary | ICD-10-CM | POA: Diagnosis not present

## 2021-10-19 DIAGNOSIS — Z1211 Encounter for screening for malignant neoplasm of colon: Secondary | ICD-10-CM | POA: Diagnosis not present

## 2021-10-19 DIAGNOSIS — L308 Other specified dermatitis: Secondary | ICD-10-CM | POA: Diagnosis not present

## 2021-10-19 DIAGNOSIS — E785 Hyperlipidemia, unspecified: Secondary | ICD-10-CM | POA: Diagnosis not present

## 2021-10-19 DIAGNOSIS — G4733 Obstructive sleep apnea (adult) (pediatric): Secondary | ICD-10-CM | POA: Diagnosis not present

## 2021-12-15 DIAGNOSIS — Z13820 Encounter for screening for osteoporosis: Secondary | ICD-10-CM | POA: Diagnosis not present

## 2021-12-27 DIAGNOSIS — M810 Age-related osteoporosis without current pathological fracture: Secondary | ICD-10-CM | POA: Diagnosis not present

## 2022-01-11 DIAGNOSIS — M810 Age-related osteoporosis without current pathological fracture: Secondary | ICD-10-CM | POA: Diagnosis not present

## 2022-01-14 DIAGNOSIS — E782 Mixed hyperlipidemia: Secondary | ICD-10-CM | POA: Diagnosis not present

## 2022-01-19 ENCOUNTER — Other Ambulatory Visit (HOSPITAL_BASED_OUTPATIENT_CLINIC_OR_DEPARTMENT_OTHER): Payer: Self-pay | Admitting: Family Medicine

## 2022-01-19 DIAGNOSIS — G4733 Obstructive sleep apnea (adult) (pediatric): Secondary | ICD-10-CM | POA: Diagnosis not present

## 2022-01-19 DIAGNOSIS — M810 Age-related osteoporosis without current pathological fracture: Secondary | ICD-10-CM | POA: Diagnosis not present

## 2022-01-19 DIAGNOSIS — Z23 Encounter for immunization: Secondary | ICD-10-CM | POA: Diagnosis not present

## 2022-01-19 DIAGNOSIS — Z8249 Family history of ischemic heart disease and other diseases of the circulatory system: Secondary | ICD-10-CM | POA: Diagnosis not present

## 2022-01-19 DIAGNOSIS — E782 Mixed hyperlipidemia: Secondary | ICD-10-CM

## 2022-01-19 DIAGNOSIS — Z6826 Body mass index (BMI) 26.0-26.9, adult: Secondary | ICD-10-CM | POA: Diagnosis not present

## 2022-01-27 DIAGNOSIS — M810 Age-related osteoporosis without current pathological fracture: Secondary | ICD-10-CM | POA: Diagnosis not present

## 2022-01-27 DIAGNOSIS — M25552 Pain in left hip: Secondary | ICD-10-CM | POA: Diagnosis not present

## 2022-01-27 DIAGNOSIS — M25551 Pain in right hip: Secondary | ICD-10-CM | POA: Diagnosis not present

## 2022-01-27 DIAGNOSIS — M545 Low back pain, unspecified: Secondary | ICD-10-CM | POA: Diagnosis not present

## 2022-01-31 ENCOUNTER — Ambulatory Visit (HOSPITAL_BASED_OUTPATIENT_CLINIC_OR_DEPARTMENT_OTHER)
Admission: RE | Admit: 2022-01-31 | Discharge: 2022-01-31 | Disposition: A | Discharge status: Home / Self Care | Payer: Self-pay | Source: Ambulatory Visit | Attending: Family Medicine | Admitting: Family Medicine

## 2022-01-31 DIAGNOSIS — E782 Mixed hyperlipidemia: Secondary | ICD-10-CM | POA: Insufficient documentation

## 2022-02-10 DIAGNOSIS — M545 Low back pain, unspecified: Secondary | ICD-10-CM | POA: Diagnosis not present

## 2022-02-10 DIAGNOSIS — M25551 Pain in right hip: Secondary | ICD-10-CM | POA: Diagnosis not present

## 2022-02-10 DIAGNOSIS — M810 Age-related osteoporosis without current pathological fracture: Secondary | ICD-10-CM | POA: Diagnosis not present

## 2022-02-10 DIAGNOSIS — M25552 Pain in left hip: Secondary | ICD-10-CM | POA: Diagnosis not present

## 2022-02-22 DIAGNOSIS — G4733 Obstructive sleep apnea (adult) (pediatric): Secondary | ICD-10-CM | POA: Diagnosis not present

## 2022-02-23 ENCOUNTER — Ambulatory Visit: Payer: 59 | Attending: Cardiology | Admitting: Cardiology

## 2022-02-23 ENCOUNTER — Encounter: Payer: Self-pay | Admitting: Cardiology

## 2022-02-23 VITALS — BP 124/74 | HR 86 | Ht 59.0 in | Wt 128.6 lb

## 2022-02-23 DIAGNOSIS — G4733 Obstructive sleep apnea (adult) (pediatric): Secondary | ICD-10-CM | POA: Diagnosis not present

## 2022-02-23 DIAGNOSIS — R0789 Other chest pain: Secondary | ICD-10-CM | POA: Diagnosis not present

## 2022-02-23 DIAGNOSIS — R002 Palpitations: Secondary | ICD-10-CM

## 2022-02-23 NOTE — Progress Notes (Addendum)
Date:  02/23/2022   ID:  Suzanne Schmidt, DOB 08/07/1960, MRN [id number]   PCP:  Cari Caraway, MD  Sleep Medicine:  Fransico Him, MD Electrophysiologist:  None   Chief Complaint:  OSA  History of Present Illness:    Suzanne Schmidt is a 62 y.o. femalewith a hx of OSA and obesity.  She is doing well with her PAP device and thinks that she has gotten used to it.  She tolerates the mask and feels the pressure is adequate.  Since going on PAP she feels rested in the am and has no significant daytime sleepiness if she slept well the night before.  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  She has problems with significant mouth dryness dryness and uses a nasal pillow mask. She has not been using a chin strap with it.  She does not think that he snores.    Prior CV studies:   The following studies were reviewed today:  EKG was performed in the office today and showed NSR with low voltage QRS  Past Medical History:  Diagnosis Date   Hypertriglyceridemia    Migraine without aura    Obesity    OSA (obstructive sleep apnea) 05/27/2007   PSG w RDI 16.3 and AHI 4.52    Sleep apnea    Past Surgical History:  Procedure Laterality Date   CESAREAN SECTION     x2   CHOLECYSTECTOMY     COLON SURGERY     ENDOMETRIAL ABLATION  2007   HTA     Current Meds  Medication Sig   Aspirin-Acetaminophen-Caffeine (MIGRAINE FORMULA PO) Take by mouth.   Calcium Carbonate (CALCIUM 600 PO) Take by mouth 2 (two) times a day. With vitamin D   clobetasol ointment (TEMOVATE) 6.38 % Apply 1 Application topically as needed (skin).   Coenzyme Q10 (CO Q-10 PO) Take by mouth.   Cyanocobalamin (VITAMIN B 12 PO) Take by mouth.   Multiple Vitamin (MULTIVITAMIN) capsule Take 1 capsule by mouth daily.   Pyridoxine HCl (VITAMIN B-6 PO) Take by mouth.   Red Yeast Rice Extract (RED YEAST RICE PO) Take by mouth.   rizatriptan (MAXALT) 10 MG tablet  Take 10 mg by mouth as needed for migraine. May repeat in 2 hours if needed   UNABLE TO FIND CPAP   [DISCONTINUED] atenolol (TENORMIN) 25 MG tablet Take 3 tablets by mouth daily.     Allergies:   Patient has no known allergies.   Social History   Tobacco Use   Smoking status: Never   Smokeless tobacco: Never  Vaping Use   Vaping Use: Never used  Substance Use Topics   Alcohol use: No   Drug use: No     Family Hx: The patient's family history includes Hypertension in her brother and father; Macular degeneration in her father; Osteoporosis in her mother; Prostate cancer in her father.  ROS:   Please see the history of present illness.     All other systems reviewed and are negative.   Labs/Other Tests and Data Reviewed:    Recent Labs: No results found for requested labs within last 365 days.   Recent Lipid Panel No results found for: "CHOL", "TRIG", "HDL", "CHOLHDL", "LDLCALC", "LDLDIRECT"  Wt Readings from Last 3 Encounters:  02/23/22 128 lb 9.6 oz (58.3 kg)  05/22/20 141 lb 9.6 oz (64.2 kg)  02/12/20 145 lb 12.8  oz (66.1 kg)     Objective:    Vital Signs:  BP 124/74   Pulse 86   Ht 4\' 11"  (1.499 m)   Wt 128 lb 9.6 oz (58.3 kg)   LMP 02/14/2006   SpO2 98%   BMI 25.97 kg/m    GEN: Well nourished, well developed in no acute distress HEENT: Normal NECK: No JVD; No carotid bruits LYMPHATICS: No lymphadenopathy CARDIAC:RRR, no murmurs, rubs, gallops RESPIRATORY:  Clear to auscultation without rales, wheezing or rhonchi  ABDOMEN: Soft, non-tender, non-distended MUSCULOSKELETAL:  No edema; No deformity  SKIN: Warm and dry NEUROLOGIC:  Alert and oriented x 3 PSYCHIATRIC:  Normal affect    ASSESSMENT & PLAN:    1. OSA - The patient is tolerating PAP therapy well without any problems. The PAP download performed by his DME was personally reviewed and interpreted by me today and showed an AHI of 0.3 /hr on 12 cm H2O with 100 % compliance in using more than 4  hours nightly.  The patient has been using and benefiting from PAP use and will continue to benefit from therapy.  -I will order a chin strap to help with dry mouth  2.  Palpitations -No arrhythmias were noted on event monitor in 2022  3. Hx of Chest heaviness -this only occurred once time during palpitations and has not had any other episodes and nothing with exertion -no workup at this time unless it is recurrent  Medication Adjustments/Labs and Tests Ordered: Current medicines are reviewed at length with the patient today.  Concerns regarding medicines are outlined above.  Tests Ordered: Orders Placed This Encounter  Procedures   EKG 12-Lead   Medication Changes: No orders of the defined types were placed in this encounter.   Disposition:  Follow up in 1 year(s)  Signed, Fransico Him, MD  02/23/2022 1:24 PM    [facility]

## 2022-02-23 NOTE — Patient Instructions (Signed)
Medication Instructions:  Your physician recommends that you continue on your current medications as directed. Please refer to the Current Medication list given to you today.  *If you need a refill on your cardiac medications before your next appointment, please call your pharmacy*   Lab Work: NONE  Testing/Procedures: NONE  Follow-Up: At [facility], you and your health needs are our priority.  As part of our continuing mission to provide you with exceptional heart care, we have created designated Provider Care Teams.  These Care Teams include your primary Cardiologist (physician) and Advanced Practice Providers (APPs -  Physician Assistants and Nurse Practitioners) who all work together to provide you with the care you need, when you need it.  Your next appointment:   1 year(s)  The format for your next appointment:   In Person  Provider:   Traci Turner, MD   Important Information About Sugar       

## 2022-02-24 ENCOUNTER — Telehealth: Payer: Self-pay | Admitting: *Deleted

## 2022-02-24 DIAGNOSIS — G4733 Obstructive sleep apnea (adult) (pediatric): Secondary | ICD-10-CM

## 2022-02-24 DIAGNOSIS — I10 Essential (primary) hypertension: Secondary | ICD-10-CM

## 2022-02-24 NOTE — Telephone Encounter (Signed)
-----   Message from [place], [place] sent at 02/23/2022  1:31 PM EST ----- Regarding: Order Earleen Reaper,  Dr. Radford Pax would like to order a chin strap for this patient.  Thank you, Drue Dun

## 2022-02-28 DIAGNOSIS — M810 Age-related osteoporosis without current pathological fracture: Secondary | ICD-10-CM | POA: Diagnosis not present

## 2022-02-28 DIAGNOSIS — M25551 Pain in right hip: Secondary | ICD-10-CM | POA: Diagnosis not present

## 2022-02-28 DIAGNOSIS — M545 Low back pain, unspecified: Secondary | ICD-10-CM | POA: Diagnosis not present

## 2022-02-28 DIAGNOSIS — M25552 Pain in left hip: Secondary | ICD-10-CM | POA: Diagnosis not present

## 2022-03-01 DIAGNOSIS — Z01419 Encounter for gynecological examination (general) (routine) without abnormal findings: Secondary | ICD-10-CM | POA: Diagnosis not present

## 2022-03-01 DIAGNOSIS — Z6826 Body mass index (BMI) 26.0-26.9, adult: Secondary | ICD-10-CM | POA: Diagnosis not present

## 2022-07-19 DIAGNOSIS — Z6825 Body mass index (BMI) 25.0-25.9, adult: Secondary | ICD-10-CM | POA: Diagnosis not present

## 2022-07-19 DIAGNOSIS — E782 Mixed hyperlipidemia: Secondary | ICD-10-CM | POA: Diagnosis not present

## 2022-07-19 DIAGNOSIS — H9313 Tinnitus, bilateral: Secondary | ICD-10-CM | POA: Diagnosis not present

## 2023-03-03 ENCOUNTER — Ambulatory Visit: Payer: 59 | Attending: Cardiology | Admitting: Cardiology

## 2023-03-03 ENCOUNTER — Telehealth: Payer: Self-pay | Admitting: *Deleted

## 2023-03-03 ENCOUNTER — Encounter: Payer: Self-pay | Admitting: Cardiology

## 2023-03-03 VITALS — BP 116/72 | HR 80 | Ht 59.0 in | Wt 125.0 lb

## 2023-03-03 DIAGNOSIS — I10 Essential (primary) hypertension: Secondary | ICD-10-CM | POA: Diagnosis not present

## 2023-03-03 DIAGNOSIS — G4733 Obstructive sleep apnea (adult) (pediatric): Secondary | ICD-10-CM

## 2023-03-03 NOTE — Progress Notes (Signed)
Date:  03/03/2023   ID:  Suzanne Schmidt, DOB 05-29-1960, MRN [id number]   PCP:  Gweneth Dimitri, MD  Sleep Medicine:  Armanda Magic, MD Electrophysiologist:  None   Chief Complaint:  OSA  History of Present Illness:    Suzanne Schmidt is a 63 y.o. femalewith a hx of OSA and obesity.  She is doing well with her PAP device and thinks that she has gotten used to it.  She still does not sleep well as she stays with her mom every 3 nights and does not get any sleep on those nights.  She tolerates the nasal pillow mask but has not been changing out the pillows every 4 weeks so it leaks and irritates her face. She feels the pressure is adequate.  She is chronically fatigued due to erratic sleep schedule.  She denies any significant nasal dryness or nasal congestion.  She has a lot of problems with dry mouth but has not used her chin strap recently.      Prior CV studies:   The following studies were reviewed today:  EKG Interpretation Date/Time:  Friday March 03 2023 09:46:36 EST Ventricular Rate:  80 PR Interval:  156 QRS Duration:  84 QT Interval:  374 QTC Calculation: 431 R Axis:   60  Text Interpretation: Normal sinus rhythm Low voltage QRS nonspecific T wave abnormality No previous ECGs available Confirmed by Armanda Magic ([id number]) on 03/03/2023 9:58:09 AM    Past Medical History:  Diagnosis Date   Hypertriglyceridemia    Migraine without aura    Obesity    OSA (obstructive sleep apnea) 05/27/2007   PSG w RDI 16.3 and AHI 4.52    Sleep apnea    Past Surgical History:  Procedure Laterality Date   CESAREAN SECTION     x2   CHOLECYSTECTOMY     COLON SURGERY     ENDOMETRIAL ABLATION  2007   HTA     Current Meds  Medication Sig   Magnesium 250 MG TABS Take 250 mg by mouth daily.   Aspirin-Acetaminophen-Caffeine (MIGRAINE FORMULA PO) Take by mouth.   Calcium Carbonate (CALCIUM 600 PO) Take by mouth 2 (two) times a day. With vitamin D   clobetasol ointment (TEMOVATE) 0.05 %  Apply 1 Application topically as needed (skin).   Coenzyme Q10 (CO Q-10 PO) Take by mouth.   Cyanocobalamin (VITAMIN B 12 PO) Take by mouth.   Multiple Vitamin (MULTIVITAMIN) capsule Take 1 capsule by mouth daily.   Pyridoxine HCl (VITAMIN B-6 PO) Take by mouth.   Red Yeast Rice Extract (RED YEAST RICE PO) Take by mouth.   rizatriptan (MAXALT) 10 MG tablet Take 10 mg by mouth as needed for migraine. May repeat in 2 hours if needed     Allergies:   Patient has no known allergies.   Social History   Tobacco Use   Smoking status: Never   Smokeless tobacco: Never  Vaping Use   Vaping status: Never Used  Substance Use Topics   Alcohol use: No   Drug use: No     Family Hx: The patient's family history includes Hypertension in her brother and father; Macular degeneration in her father; Osteoporosis in her mother; Prostate cancer in her father.  ROS:   Please see the history of present illness.     All other systems reviewed and are negative.   Labs/Other Tests and Data Reviewed:    Recent Labs: No results found for requested labs within last 365 days.  Recent Lipid Panel No results found for: "CHOL", "TRIG", "HDL", "CHOLHDL", "LDLCALC", "LDLDIRECT"  Wt Readings from Last 3 Encounters:  03/03/23 125 lb (56.7 kg)  02/23/22 128 lb 9.6 oz (58.3 kg)  05/22/20 141 lb 9.6 oz (64.2 kg)     Objective:    Vital Signs:  BP 116/72   Pulse 80   Ht 4\' 11"  (1.499 m)   Wt 125 lb (56.7 kg)   LMP 02/14/2006   BMI 25.25 kg/m    GEN: Well nourished, well developed in no acute distress HEENT: Normal NECK: No JVD; No carotid bruits LYMPHATICS: No lymphadenopathy CARDIAC:RRR, no murmurs, rubs, gallops RESPIRATORY:  Clear to auscultation without rales, wheezing or rhonchi  ABDOMEN: Soft, non-tender, non-distended MUSCULOSKELETAL:  No edema; No deformity  SKIN: Warm and dry NEUROLOGIC:  Alert and oriented x 3 PSYCHIATRIC:  Normal affect  ASSESSMENT & PLAN:    OSA - The patient  is tolerating PAP therapy well without any problems. The PAP download performed by his DME was personally reviewed and interpreted by me today and showed an AHI of 0.5/hr on 12 cm H2O with 97% compliance in using more than 4 hours nightly.  The patient has been using and benefiting from PAP use and will continue to benefit from therapy.  -her device is over 5 years so I will order her a new ResMed CPAP at 12cm H2O with heated humidity and mask of choice  Palpitations -No arrhythmias were noted on event monitor in 2022  Hx of Chest heaviness -this only occurred once time during palpitations and has not had any other episodes and nothing with exertion -no workup at this time unless it is recurrent  Medication Adjustments/Labs and Tests Ordered: Current medicines are reviewed at length with the patient today.  Concerns regarding medicines are outlined above.  Tests Ordered: Orders Placed This Encounter  Procedures   EKG 12-Lead   Medication Changes: No orders of the defined types were placed in this encounter.   Disposition:  Follow up in 1 year(s)  Signed, Armanda Magic, MD  03/03/2023 9:58 AM    [facility]

## 2023-03-03 NOTE — Telephone Encounter (Signed)
Order placed to [facility] via community message. 

## 2023-03-03 NOTE — Telephone Encounter (Signed)
-----   Message from Armanda Magic sent at 03/03/2023 10:07 AM EST ----- order her a new ResMed CPAP at 12cm H2O with heated humidity and mask of choice

## 2023-03-17 ENCOUNTER — Telehealth: Payer: Self-pay | Admitting: Cardiology

## 2023-03-17 NOTE — Telephone Encounter (Signed)
Patient is calling wanting to know why the new CPAP order was placed to [facility] due to her currently using [facility].   She states a VM can be left with this information if she does not answer when calling back.   Please advise.

## 2023-03-27 NOTE — Telephone Encounter (Signed)
 Pt called stating she got new insurance [employer] and she would CPAP order to go to [facility] now.

## 2023-04-04 NOTE — Telephone Encounter (Signed)
 Order sent to [facility] for new ResMed CPAP at 12cm H2O with heated humidity and mask of choice.  DME is Upon patient request DME selection is [facility] Patient understands he will be contacted by [facility] to set up his cpap. Patient understands to call if [facility] does not contact him with new setup in a timely manner. Patient understands they will be called once confirmation has been received from [facility] that they have received their new machine to schedule 10 week follow up appointment.   [facility] notified of new cpap order  Please add to airview Patient was grateful for the call and thanked me

## 2023-04-07 NOTE — Telephone Encounter (Addendum)
 Suzanne Schmidt was out of network with her insurance  order her a new ResMed CPAP at 12cm H2O with heated humidity and mask of choice   Upon patient request DME selection is [facility]. Patient understands he will be contacted by [facility] to set up his cpap. Patient understands to call if [facility] does not contact him with new setup in a timely manner. Patient understands they will be called once confirmation has been received from [facility] that they have received their new machine to schedule 10 week follow up appointment.   [facility] notified of new cpap order  Please add to airview Patient was grateful for the call and thanked me.

## 2023-05-11 ENCOUNTER — Telehealth: Payer: Self-pay | Admitting: Cardiology

## 2023-05-11 NOTE — Telephone Encounter (Signed)
 Patient is following-up on her referral to [facility] to get her CPAP supplies.

## 2023-05-18 NOTE — Telephone Encounter (Signed)
 Pt would like a c/b in regards to this matter. Please advise

## 2023-05-19 NOTE — Telephone Encounter (Addendum)
 Notified patient that she is now in network with [facility] with her new insurance. Order has been place through [facility] today.

## 2023-06-06 NOTE — Telephone Encounter (Signed)
 Pt called in and stated she has not rec'd cpap.  She was following up on the order?    [phone] [id number]

## 2023-06-13 NOTE — Telephone Encounter (Signed)
 Notified patient that [facility] has been contacted by sleep coordinator. Per [facility], order for CPAP and supplies has been received and is currently being processed. Patient will call sleep coordinator if unsuccessful with their communication with [facility].

## 2023-07-05 ENCOUNTER — Telehealth: Payer: Self-pay | Admitting: Cardiology

## 2023-07-05 NOTE — Telephone Encounter (Signed)
 Pt returning call regarding CPAP replacement. Please advise

## 2023-07-11 NOTE — Telephone Encounter (Signed)
 Patient will upload new insurance to MyChart. Previous PAP order was sent to [facility] based on documented insurance for patient. PAP order has now been sent to [facility]. Notified patient of change, all questions were answered and patient verbalized understanding.

## 2024-02-19 ENCOUNTER — Telehealth: Payer: Self-pay

## 2024-02-19 NOTE — Telephone Encounter (Signed)
 SABRA

## 2024-02-19 NOTE — Progress Notes (Signed)
 "  Date:  02/20/2024   ID:  Suzanne Schmidt, DOB Sep 25, 1960, MRN [id number]   PCP:  Cleotilde Planas, MD  Sleep Medicine:  Wilbert Bihari, MD Electrophysiologist:  None   Chief Complaint:  OSA  History of Present Illness:    Suzanne Schmidt is a 64 y.o. femalewith a hx of OSA and obesity.  She is doing well with her PAP device and thinks that she has gotten used to it.  She tolerates the mask and feels the pressure is adequate.  Since going on PAP she feels rested in the am and has no significant daytime sleepiness if she slept well the night before.  She denies any significant nasal dryness or nasal congestion. She has some dry mouth but uses a nasal pillow mask and does not like the chin strap. She does not think that he snores. An Epworth Sleepiness Scale score was calculated the office today and this endorsed at 5 arguing against residual daytime sleepiness. Patient denies any episodes of bruxism, restless legs, No gagging hallucinations or cataplectic events.  She has a lot of problems with dry mouth but has not used her chin strap recently.    She was supposed to get a new device last time but that did not happen.   Prior CV studies:   The following studies were reviewed today:       Past Medical History:  Diagnosis Date   Hypertriglyceridemia    Migraine without aura    Obesity    OSA (obstructive sleep apnea) 05/27/2007   PSG w RDI 16.3 and AHI 4.52    Sleep apnea    Past Surgical History:  Procedure Laterality Date   CESAREAN SECTION     x2   CHOLECYSTECTOMY     COLON SURGERY     ENDOMETRIAL ABLATION  2007   HTA     Current Meds  Medication Sig   Aspirin-Acetaminophen-Caffeine (MIGRAINE FORMULA PO) Take by mouth.   Calcium Carbonate (CALCIUM 600 PO) Take by mouth 2 (two) times a day. With vitamin D   clobetasol ointment (TEMOVATE) 0.05 % Apply 1 Application topically as needed (skin).   Coenzyme Q10 (CO Q-10 PO) Take by mouth.   Red Yeast Rice Extract (RED YEAST RICE PO)  Take by mouth.     Allergies:   Patient has no known allergies.   Social History   Tobacco Use   Smoking status: Never   Smokeless tobacco: Never  Vaping Use   Vaping status: Never Used  Substance Use Topics   Alcohol use: No   Drug use: No     Family Hx: The patient's family history includes Hypertension in her brother and father; Macular degeneration in her father; Osteoporosis in her mother; Prostate cancer in her father.  ROS:   Please see the history of present illness.     All other systems reviewed and are negative.   Labs/Other Tests and Data Reviewed:    Recent Labs: No results found for requested labs within last 365 days.   Recent Lipid Panel No results found for: CHOL, TRIG, HDL, CHOLHDL, LDLCALC, LDLDIRECT  Wt Readings from Last 3 Encounters:  02/20/24 131 lb (59.4 kg)  03/03/23 125 lb (56.7 kg)  02/23/22 128 lb 9.6 oz (58.3 kg)     Objective:    Vital Signs:  BP 115/78 (BP Location: Left Arm, Patient Position: Sitting)   Pulse 84   Ht 4' 11 (1.499 m)   Wt 131 lb (59.4 kg)  LMP 02/14/2006   SpO2 99%   BMI 26.46 kg/m    GEN: Well nourished, well developed in no acute distress HEENT: Normal NECK: No JVD; No carotid bruits LYMPHATICS: No lymphadenopathy CARDIAC:RRR, no murmurs, rubs, gallops RESPIRATORY:  Clear to auscultation without rales, wheezing or rhonchi  ABDOMEN: Soft, non-tender, non-distended MUSCULOSKELETAL:  No edema; No deformity  SKIN: Warm and dry NEUROLOGIC:  Alert and oriented x 3 PSYCHIATRIC:  Normal affect  ASSESSMENT & PLAN:    OSA - The patient is tolerating PAP therapy well without any problems. The PAP download performed by his DME was personally reviewed and interpreted by me today and showed an AHI of 0.5/hr on 12 cm H2O with 100% compliance in using more than 4 hours nightly.  The patient has been using and benefiting from PAP use and will continue to benefit from therapy.  -She would like a new device  so will order a new ResMed Airsense 11 CPAP at 12cm H2O with heated humidity and under the nose FFM   Medication Adjustments/Labs and Tests Ordered: Current medicines are reviewed at length with the patient today.  Concerns regarding medicines are outlined above.  Tests Ordered: No orders of the defined types were placed in this encounter.  Medication Changes: No orders of the defined types were placed in this encounter.   Disposition:  Follow up 6 weeks after getting her new device  Signed, Wilbert Bihari, MD  02/20/2024 8:13 AM    [facility] "

## 2024-02-20 ENCOUNTER — Ambulatory Visit: Attending: Cardiology | Admitting: Cardiology

## 2024-02-20 ENCOUNTER — Encounter: Payer: Self-pay | Admitting: Cardiology

## 2024-02-20 VITALS — BP 115/78 | HR 84 | Ht 59.0 in | Wt 131.0 lb

## 2024-02-20 DIAGNOSIS — G4733 Obstructive sleep apnea (adult) (pediatric): Secondary | ICD-10-CM

## 2024-02-20 NOTE — Patient Instructions (Signed)
 Medication Instructions:  Your physician recommends that you continue on your current medications as directed. Please refer to the Current Medication list given to you today.  *If you need a refill on your cardiac medications before your next appointment, please call your pharmacy*  Lab Work: None.  If you have labs (blood work) drawn today and your tests are completely normal, you will receive your results only by: MyChart Message (if you have MyChart) OR A paper copy in the mail If you have any lab test that is abnormal or we need to change your treatment, we will call you to review the results.  Testing/Procedures: None.  Follow-Up: At [facility], you and your health needs are our priority.  As part of our continuing mission to provide you with exceptional heart care, our providers are all part of one team.  This team includes your primary Cardiologist (physician) and Advanced Practice Providers or APPs (Physician Assistants and Nurse Practitioners) who all work together to provide you with the care you need, when you need it.  Your next appointment will be dependent upon the delivery of your cpap device and it will be with:    Provider:   Dr. Wilbert Bihari, MD

## 2024-02-28 ENCOUNTER — Telehealth: Payer: Self-pay | Admitting: *Deleted

## 2024-02-28 DIAGNOSIS — I10 Essential (primary) hypertension: Secondary | ICD-10-CM

## 2024-02-28 DIAGNOSIS — G4733 Obstructive sleep apnea (adult) (pediatric): Secondary | ICD-10-CM

## 2024-02-28 NOTE — Telephone Encounter (Signed)
-----   Message from Wilbert Bihari, MD sent at 02/20/2024  8:15 AM EST ----- order a new ResMed Airsense 11 CPAP at 12cm H2O with heated humidity and under the nose FFM>>she will need a mask fitting>>DME is [facility]

## 2024-02-28 NOTE — Telephone Encounter (Signed)
 Order placed to [facility] via [employer].  Upon patient request DME selection is [facility] Patient understands he will be contacted by [facility] to set up his cpap.

## 2024-03-08 ENCOUNTER — Encounter: Payer: Self-pay | Admitting: Cardiology

## 2024-03-27 ENCOUNTER — Ambulatory Visit: Admitting: Cardiology
